# Patient Record
Sex: Female | Born: 1980 | Race: White | Hispanic: No | Marital: Married | Smoking: Never smoker
Health system: Southern US, Community
[De-identification: ages and names within clinical notes are randomized; demographics above are authoritative.]

## PROBLEM LIST (undated history)

## (undated) DIAGNOSIS — H9190 Unspecified hearing loss, unspecified ear: Secondary | ICD-10-CM

## (undated) DIAGNOSIS — T7840XA Allergy, unspecified, initial encounter: Secondary | ICD-10-CM

## (undated) DIAGNOSIS — N200 Calculus of kidney: Secondary | ICD-10-CM

## (undated) HISTORY — DX: Calculus of kidney: N20.0

## (undated) HISTORY — DX: Unspecified hearing loss, unspecified ear: H91.90

## (undated) HISTORY — DX: Allergy, unspecified, initial encounter: T78.40XA

## (undated) HISTORY — PX: INNER EAR SURGERY: SHX679

---

## 2010-01-14 HISTORY — PX: GASTRIC BYPASS: SHX52

## 2019-02-03 ENCOUNTER — Ambulatory Visit: Payer: Self-pay | Admitting: Internal Medicine

## 2019-03-02 ENCOUNTER — Encounter: Payer: Self-pay | Admitting: Internal Medicine

## 2019-03-02 ENCOUNTER — Other Ambulatory Visit: Payer: Self-pay

## 2019-03-02 ENCOUNTER — Ambulatory Visit (INDEPENDENT_AMBULATORY_CARE_PROVIDER_SITE_OTHER): Payer: Commercial Managed Care - PPO | Admitting: Internal Medicine

## 2019-03-02 VITALS — BP 124/82 | HR 90 | Temp 99.0°F | Ht 63.5 in | Wt 213.0 lb

## 2019-03-02 DIAGNOSIS — D1722 Benign lipomatous neoplasm of skin and subcutaneous tissue of left arm: Secondary | ICD-10-CM | POA: Diagnosis not present

## 2019-03-02 DIAGNOSIS — L729 Follicular cyst of the skin and subcutaneous tissue, unspecified: Secondary | ICD-10-CM | POA: Diagnosis not present

## 2019-03-02 DIAGNOSIS — Z9884 Bariatric surgery status: Secondary | ICD-10-CM | POA: Diagnosis not present

## 2019-03-02 NOTE — Progress Notes (Signed)
Date:  03/02/2019   Name:  Marcia Moore   DOB:  [birth year]   MRN:  [id number]  New patient to establish care. Chief Complaint: Establish Care, Back Pain (lower right side, present for 1 year, close to spine feels like a bump under skin ), and Mass (left arm, no pain, present for 1 year ) Last pap 2017 - normal Mammogram - none HPI   Review of Systems  Constitutional: Negative for fatigue and unexpected weight change.  Respiratory: Negative for cough, chest tightness and shortness of breath.   Cardiovascular: Negative for chest pain, palpitations and leg swelling.  Gastrointestinal: Negative for abdominal pain, constipation and diarrhea.  Genitourinary: Negative for dysuria and menstrual problem.  Musculoskeletal: Positive for arthralgias (left shoulder strain - much improved).  Skin:       Lumps on left arm and low back  Neurological: Negative for dizziness, light-headedness and headaches.  Psychiatric/Behavioral: Negative for dysphoric mood and sleep disturbance. The patient is not nervous/anxious.     Patient Active Problem List   Diagnosis Date Noted  . Status post gastric bypass for obesity 03/02/2019  . Lipoma of left upper extremity 03/02/2019    No Known Allergies  Past Surgical History:  Procedure Laterality Date  . CESAREAN SECTION    . GASTRIC BYPASS  2012  . INNER EAR SURGERY      Social History   Tobacco Use  . Smoking status: Never Smoker  . Smokeless tobacco: Never Used  Substance Use Topics  . Alcohol use: Never  . Drug use: Never     Medication list has been reviewed and updated.  Current Meds  Medication Sig  . cholecalciferol (VITAMIN D3) 25 MCG (1000 UNIT) tablet Take 1,000 Units by mouth daily.  . magnesium 30 MG tablet Take 30 mg by mouth 2 (two) times daily.    PHQ 2/9 Scores 03/02/2019  PHQ - 2 Score 0  PHQ- 9 Score 0    BP Readings from Last 3 Encounters:  03/02/19 124/82    Physical Exam Vitals and nursing note reviewed.    Constitutional:      General: She is not in acute distress.    Appearance: Normal appearance. She is well-developed.  HENT:     Head: Normocephalic and atraumatic.  Cardiovascular:     Rate and Rhythm: Normal rate and regular rhythm.     Pulses: Normal pulses.     Heart sounds: No murmur.  Pulmonary:     Effort: Pulmonary effort is normal. No respiratory distress.  Musculoskeletal:     Cervical back: Normal range of motion.     Right lower leg: No edema.     Left lower leg: No edema.  Lymphadenopathy:     Cervical: No cervical adenopathy.  Skin:    General: Skin is warm and dry.     Capillary Refill: Capillary refill takes less than 2 seconds.     Findings: No rash.       Neurological:     General: No focal deficit present.     Mental Status: She is alert and oriented to person, place, and time.  Psychiatric:        Attention and Perception: Attention normal.        Mood and Affect: Mood normal.        Speech: Speech normal.        Behavior: Behavior normal.        Thought Content: Thought content normal.  Cognition and Memory: Cognition normal.     Wt Readings from Last 3 Encounters:  03/02/19 213 lb (96.6 kg)    BP 124/82   Pulse 90   Temp 99 F (37.2 C) (Oral)   Ht 5' 3.5" (1.613 m)   Wt 213 lb (96.6 kg)   LMP 02/21/2018 (Exact Date)   SpO2 98%   BMI 37.14 kg/m   Assessment and Plan: 1. Lipoma of left upper extremity Pt is assured - benign findings If it enlarges or becomes symptomatic, it can be removed  2. Status post gastric bypass for obesity Doing well maintaining her weight loss  3. Cyst of skin Of the lower back - benign finding  Return for CPX and Pap in the near future. Partially dictated using Editor, commissioning. Any errors are unintentional.  Halina Maidens, MD [facility]  03/02/2019

## 2019-03-29 ENCOUNTER — Encounter: Payer: Self-pay | Admitting: Internal Medicine

## 2019-04-05 ENCOUNTER — Encounter: Payer: Self-pay | Admitting: Internal Medicine

## 2019-04-05 NOTE — Progress Notes (Deleted)
Date:  04/05/2019   Name:  Marcia Moore   DOB:  1980/02/19   MRN:  [id number]   Chief Complaint: No chief complaint on file. Marcia Moore is a 39 y.o. female who presents today for her Complete Annual Exam. She feels {DESC; WELL/FAIRLY WELL/POORLY:18703}. She reports exercising ***. She reports she is sleeping {DESC; WELL/FAIRLY WELL/POORLY:18703}.   Pap  There is no immunization history on file for this patient.  HPI    Review of Systems  Constitutional: Negative for chills, fatigue and fever.  HENT: Negative for congestion, hearing loss, tinnitus, trouble swallowing and voice change.   Eyes: Negative for visual disturbance.  Respiratory: Negative for cough, chest tightness, shortness of breath and wheezing.   Cardiovascular: Negative for chest pain, palpitations and leg swelling.  Gastrointestinal: Negative for abdominal pain, constipation, diarrhea and vomiting.  Endocrine: Negative for polydipsia and polyuria.  Genitourinary: Negative for dysuria, frequency, genital sores, vaginal bleeding and vaginal discharge.  Musculoskeletal: Negative for arthralgias, gait problem and joint swelling.  Skin: Negative for color change and rash.  Neurological: Negative for dizziness, tremors, light-headedness and headaches.  Hematological: Negative for adenopathy. Does not bruise/bleed easily.  Psychiatric/Behavioral: Negative for dysphoric mood and sleep disturbance. The patient is not nervous/anxious.     Patient Active Problem List   Diagnosis Date Noted  . Status post gastric bypass for obesity 03/02/2019  . Lipoma of left upper extremity 03/02/2019    No Known Allergies  Past Surgical History:  Procedure Laterality Date  . CESAREAN SECTION    . GASTRIC BYPASS  2012  . INNER EAR SURGERY      Social History   Tobacco Use  . Smoking status: Never Smoker  . Smokeless tobacco: Never Used  Substance Use Topics  . Alcohol use: Never  . Drug use: Never     Medication  list has been reviewed and updated.  No outpatient medications have been marked as taking for the 04/05/19 encounter (Appointment) with Glean Hess, MD.    [facility] 2/9 Scores 03/02/2019  PHQ - 2 Score 0  PHQ- 9 Score 0    BP Readings from Last 3 Encounters:  03/02/19 124/82    Physical Exam Vitals and nursing note reviewed.  Constitutional:      General: She is not in acute distress.    Appearance: She is well-developed.  HENT:     Head: Normocephalic and atraumatic.     Right Ear: Tympanic membrane and ear canal normal.     Left Ear: Tympanic membrane and ear canal normal.     Nose:     Right Sinus: No maxillary sinus tenderness.     Left Sinus: No maxillary sinus tenderness.  Eyes:     General: No scleral icterus.       Right eye: No discharge.        Left eye: No discharge.     Conjunctiva/sclera: Conjunctivae normal.  Neck:     Thyroid: No thyromegaly.     Vascular: No carotid bruit.  Cardiovascular:     Rate and Rhythm: Normal rate and regular rhythm.     Pulses: Normal pulses.     Heart sounds: Normal heart sounds.  Pulmonary:     Effort: Pulmonary effort is normal. No respiratory distress.     Breath sounds: No wheezing.  Chest:     Breasts:        Right: No mass, nipple discharge, skin change or tenderness.  Left: No mass, nipple discharge, skin change or tenderness.  Abdominal:     General: Bowel sounds are normal.     Palpations: Abdomen is soft.     Tenderness: There is no abdominal tenderness.  Musculoskeletal:        General: Normal range of motion.     Cervical back: Normal range of motion. No erythema.  Lymphadenopathy:     Cervical: No cervical adenopathy.  Skin:    General: Skin is warm and dry.     Findings: No rash.  Neurological:     Mental Status: She is alert and oriented to person, place, and time.     Cranial Nerves: No cranial nerve deficit.     Sensory: No sensory deficit.     Deep Tendon Reflexes: Reflexes are normal and  symmetric.  Psychiatric:        Speech: Speech normal.        Behavior: Behavior normal.        Thought Content: Thought content normal.     Wt Readings from Last 3 Encounters:  03/02/19 213 lb (96.6 kg)    There were no vitals taken for this visit.  Assessment and Plan:

## 2019-04-08 ENCOUNTER — Encounter: Payer: Self-pay | Admitting: Emergency Medicine

## 2019-04-08 ENCOUNTER — Ambulatory Visit
Admission: EM | Admit: 2019-04-08 | Discharge: 2019-04-08 | Disposition: A | Discharge status: Home / Self Care | Payer: Commercial Managed Care - PPO

## 2019-04-08 ENCOUNTER — Other Ambulatory Visit: Payer: Self-pay

## 2019-04-08 DIAGNOSIS — L0231 Cutaneous abscess of buttock: Secondary | ICD-10-CM | POA: Diagnosis not present

## 2019-04-08 NOTE — ED Triage Notes (Signed)
Pt c/o a "bump" between the anus and the vagina area. Started a couple weeks ago. She states that it is only painful when it is squeezed. No drainage.

## 2019-04-08 NOTE — Discharge Instructions (Signed)
Sit on a heating pad for 15 minutes 2-4 times a day to help the rest of the puss drain out. The packing may fall out in 12-24 hours or the next time you use the bathroom, that is OK, it is just to help the rest of the puss drain out and the opening is very small to keep the packing longer. Come back if this area closes and gets larger than when you came today.

## 2019-04-08 NOTE — ED Provider Notes (Signed)
[facility]    CSN: [id number] Arrival date & time: 04/08/19  [id number]      History   Chief Complaint Chief Complaint  Patient presents with  . Abscess    HPI Marcia Moore is a 39 y.o. female. who found a lump on the perineal area  2 weeks ago. Today is a little raw, but prior to this has not heard. Denies any new sexual encounters and her current partner does not have symptoms.  She has never had this before. She requests she is not placed on antibiotics and prefers to have it drained.     Past Medical History:  Diagnosis Date  . Allergy   . Hearing loss    left ear    Patient Active Problem List   Diagnosis Date Noted  . Status post gastric bypass for obesity 03/02/2019  . Lipoma of left upper extremity 03/02/2019    Past Surgical History:  Procedure Laterality Date  . CESAREAN SECTION    . GASTRIC BYPASS  2012  . INNER EAR SURGERY      OB History   No obstetric history on file.      Home Medications    Prior to Admission medications   Medication Sig Start Date End Date Taking? Authorizing Provider  cholecalciferol (VITAMIN D3) 25 MCG (1000 UNIT) tablet Take 1,000 Units by mouth daily.   Yes [provider]  COLLAGEN PO Take by mouth.   Yes [provider]  magnesium 30 MG tablet Take 30 mg by mouth 2 (two) times daily.   Yes [provider]    Family History Family History  Problem Relation Age of Onset  . Diabetes Father   . Hypertension Father   . Bone cancer Paternal Grandfather     Social History Social History   Tobacco Use  . Smoking status: Never Smoker  . Smokeless tobacco: Never Used  Substance Use Topics  . Alcohol use: Never  . Drug use: Never     Allergies   Patient has no known allergies.   Review of Systems Review of Systems  Constitutional: Negative for chills and fever.  Musculoskeletal: Negative for myalgias.  Skin:       Lump on R inner buttocks  Hematological: Negative for  adenopathy.     Physical Exam Triage Vital Signs ED Triage Vitals  Enc Vitals Group     BP 04/08/19 0921 (!) 136/92     Pulse Rate 04/08/19 0921 (!) 108     Resp 04/08/19 0921 18     Temp 04/08/19 0921 98.2 F (36.8 C)     Temp Source 04/08/19 0921 Oral     SpO2 04/08/19 0921 100 %     Weight 04/08/19 0918 207 lb (93.9 kg)     Height 04/08/19 0918 5\' 3"  (1.6 m)     Head Circumference --      Peak Flow --      Pain Score 04/08/19 0918 0     Pain Loc --      Pain Edu? --      Excl. in [place]? --    No data found.  Updated Vital Signs BP (!) 136/92 (BP Location: Left Arm)   Pulse (!) 108   Temp 98.2 F (36.8 C) (Oral)   Resp 18   Ht 5\' 3"  (1.6 m)   Wt 207 lb (93.9 kg)   LMP 04/02/2019 (Approximate)   SpO2 100%   BMI 36.67 kg/m   Visual  Acuity Right Eye Distance:   Left Eye Distance:   Bilateral Distance:    Right Eye Near:   Left Eye Near:    Bilateral Near:     Physical Exam Vitals and nursing note reviewed.  Constitutional:      Appearance: She is obese.  HENT:     Right Ear: External ear normal.     Left Ear: External ear normal.  Eyes:     General: No scleral icterus.    Conjunctiva/sclera: Conjunctivae normal.  Pulmonary:     Effort: Pulmonary effort is normal.  Genitourinary:    General: Normal vulva.     Rectum: Normal.  Musculoskeletal:        General: Normal range of motion.     Cervical back: Neck supple.  Skin:    General: Skin is warm and dry.     Comments: 1.3 x 1.3 cm fluctuant mass present on R inner buttocks about 2 cm from anus. Has mild tenderness and erythema.   Neurological:     Mental Status: She is alert and oriented to person, place, and time.     Gait: Gait normal.  Psychiatric:        Mood and Affect: Mood normal.        Behavior: Behavior normal.        Thought Content: Thought content normal.        Judgment: Judgment normal.      UC Treatments / Results  Labs (all labs ordered are listed, but only abnormal results  are displayed) Labs Reviewed - No data to display  EKG   Radiology No results found.  Procedures Incision and Drainage  Date/Time: 04/08/2019 10:17 AM Performed by: Shelby Mattocks, PA-C Authorized by: Shelby Mattocks, PA-C   Consent:    Consent obtained:  Verbal   Consent given by:  Patient   Risks discussed:  Pain Location:    Type:  Abscess   Location: R inner buttocks. Pre-procedure details:    Skin preparation:  Betadine Anesthesia (see MAR for exact dosages):    Anesthesia method:  Local infiltration   Local anesthetic:  Lidocaine 1% WITH epi Procedure type:    Complexity:  Simple Procedure details:    Needle aspiration: no     Incision types:  Stab incision   Incision depth:  Dermal   Scalpel blade:  11   Wound management:  Irrigated with saline   Drainage:  Purulent   Drainage amount:  Scant   Wound treatment:  Wound left open   Packing materials:  1/2 in gauze   Amount 1/2":  1.2 cm Post-procedure details:    Patient tolerance of procedure:  Tolerated well, no immediate complications Comments:     4 folded 4x4's applied on area between but cheeks, but no tape was applied.    (including critical care time)  Medications Ordered in UC Medications - No data to display  Initial Impression / Assessment and Plan / UC Course  I have reviewed the triage vital signs and the nursing notes. Wound care instructed. Per her request I did not place her on antibiotics. FU if she gets worse.   Final Clinical Impressions(s) / UC Diagnoses   Final diagnoses:  None   Discharge Instructions   None    ED Prescriptions    None     PDMP not reviewed this encounter.   Shelby Mattocks, PA-C 04/08/19 1149

## 2019-04-12 ENCOUNTER — Encounter: Payer: Self-pay | Admitting: Internal Medicine

## 2019-04-14 ENCOUNTER — Other Ambulatory Visit (HOSPITAL_COMMUNITY)
Admission: RE | Admit: 2019-04-14 | Discharge: 2019-04-14 | Disposition: A | Discharge status: Home / Self Care | Payer: Commercial Managed Care - PPO | Source: Ambulatory Visit | Attending: Internal Medicine | Admitting: Internal Medicine

## 2019-04-14 ENCOUNTER — Encounter: Payer: Self-pay | Admitting: Internal Medicine

## 2019-04-14 ENCOUNTER — Other Ambulatory Visit: Payer: Self-pay

## 2019-04-14 ENCOUNTER — Telehealth: Payer: Self-pay

## 2019-04-14 ENCOUNTER — Ambulatory Visit (INDEPENDENT_AMBULATORY_CARE_PROVIDER_SITE_OTHER): Payer: Commercial Managed Care - PPO | Admitting: Internal Medicine

## 2019-04-14 VITALS — BP 108/68 | HR 96 | Temp 97.1°F | Ht 63.5 in | Wt 212.0 lb

## 2019-04-14 DIAGNOSIS — N6012 Diffuse cystic mastopathy of left breast: Secondary | ICD-10-CM

## 2019-04-14 DIAGNOSIS — N6011 Diffuse cystic mastopathy of right breast: Secondary | ICD-10-CM | POA: Diagnosis not present

## 2019-04-14 DIAGNOSIS — Z124 Encounter for screening for malignant neoplasm of cervix: Secondary | ICD-10-CM | POA: Insufficient documentation

## 2019-04-14 DIAGNOSIS — R3121 Asymptomatic microscopic hematuria: Secondary | ICD-10-CM

## 2019-04-14 DIAGNOSIS — Z9884 Bariatric surgery status: Secondary | ICD-10-CM

## 2019-04-14 DIAGNOSIS — Z0000 Encounter for general adult medical examination without abnormal findings: Secondary | ICD-10-CM

## 2019-04-14 LAB — POCT URINALYSIS DIPSTICK
Bilirubin, UA: NEGATIVE
Glucose, UA: NEGATIVE
Ketones, UA: NEGATIVE
Leukocytes, UA: NEGATIVE
Nitrite, UA: NEGATIVE
Protein, UA: POSITIVE — AB
Spec Grav, UA: 1.015 (ref 1.010–1.025)
Urobilinogen, UA: 0.2 E.U./dL
pH, UA: 7 (ref 5.0–8.0)

## 2019-04-14 MED ORDER — CIPROFLOXACIN HCL 250 MG PO TABS: 250.0000 mg | ORAL_TABLET | Freq: Two times a day (BID) | ORAL | 0 refills | Status: AC

## 2019-04-14 NOTE — Progress Notes (Signed)
Date:  04/14/2019   Name:  Marcia Moore   DOB:  Jul 15, 1980   MRN:  [id number]   Chief Complaint: Annual Exam (Breast exam and papsmear.) Marcia Moore is a 39 y.o. female who presents today for her Complete Annual Exam. She feels well. She reports exercising intermittently. She reports she is sleeping fairly well. She denies breast issues.  Menses are regular.  She is following a keto diet.  She is concerned about her cholesterol and ask about a lipo-protein panel.  Pap - unknown  There is no immunization history on file for this patient.  HPI  Review of Systems  Constitutional: Negative for fatigue and unexpected weight change.  HENT: Negative for hearing loss and trouble swallowing.   Eyes: Negative for visual disturbance.  Respiratory: Negative for cough, chest tightness and shortness of breath.   Cardiovascular: Negative for chest pain, palpitations and leg swelling.  Gastrointestinal: Negative for abdominal pain, constipation and diarrhea.  Genitourinary: Negative for dysuria, frequency, hematuria and menstrual problem.  Musculoskeletal: Positive for arthralgias (left shoulder strain - much improved).  Skin: Positive for wound (abscess near buttock - drained last week).       Lipoma on arm and cyst on back - unchanged  Neurological: Negative for dizziness, light-headedness and headaches.  Hematological: Negative for adenopathy.  Psychiatric/Behavioral: Negative for dysphoric mood and sleep disturbance. The patient is not nervous/anxious.     Patient Active Problem List   Diagnosis Date Noted  . Status post gastric bypass for obesity 03/02/2019  . Lipoma of left upper extremity 03/02/2019    No Known Allergies  Past Surgical History:  Procedure Laterality Date  . CESAREAN SECTION    . GASTRIC BYPASS  2012  . INNER EAR SURGERY      Social History   Tobacco Use  . Smoking status: Never Smoker  . Smokeless tobacco: Never Used  Substance Use Topics  . Alcohol use:  Never  . Drug use: Never     Medication list has been reviewed and updated.  Current Meds  Medication Sig  . cholecalciferol (VITAMIN D3) 25 MCG (1000 UNIT) tablet Take 1,000 Units by mouth daily.  . COLLAGEN PO Take by mouth.  . magnesium 30 MG tablet Take 30 mg by mouth 2 (two) times daily.    PHQ 2/9 Scores 04/14/2019 03/02/2019  PHQ - 2 Score 0 0  PHQ- 9 Score - 0    BP Readings from Last 3 Encounters:  04/14/19 108/68  04/08/19 (!) 136/92  03/02/19 124/82    Physical Exam Vitals and nursing note reviewed.  Constitutional:      General: She is not in acute distress.    Appearance: She is well-developed.  HENT:     Head: Normocephalic and atraumatic.     Right Ear: Tympanic membrane and ear canal normal.     Left Ear: Tympanic membrane and ear canal normal.  Eyes:     General: No scleral icterus.       Right eye: No discharge.        Left eye: No discharge.     Conjunctiva/sclera: Conjunctivae normal.  Neck:     Thyroid: No thyromegaly.     Vascular: No carotid bruit.  Cardiovascular:     Rate and Rhythm: Normal rate and regular rhythm.     Pulses: Normal pulses.     Heart sounds: Normal heart sounds.  Pulmonary:     Effort: Pulmonary effort is normal. No respiratory distress.  Breath sounds: No wheezing.  Chest:     Breasts:        Right: No mass, nipple discharge, skin change or tenderness.        Left: No mass, nipple discharge, skin change or tenderness.     Comments: Fibrocystic changes in both breasts Abdominal:     General: Bowel sounds are normal.     Palpations: Abdomen is soft.     Tenderness: There is no abdominal tenderness.  Genitourinary:    Labia:        Right: No tenderness, lesion or injury.        Left: No tenderness, lesion or injury.      Vagina: Normal.     Cervix: Normal.     Uterus: Normal.      Adnexa: Right adnexa normal and left adnexa normal.    Musculoskeletal:        General: Normal range of motion.     Cervical  back: Normal range of motion. No erythema.     Right lower leg: No edema.     Left lower leg: No edema.  Lymphadenopathy:     Cervical: No cervical adenopathy.  Skin:    General: Skin is warm and dry.     Capillary Refill: Capillary refill takes less than 2 seconds.     Findings: No rash.  Neurological:     General: No focal deficit present.     Mental Status: She is alert and oriented to person, place, and time.     Cranial Nerves: No cranial nerve deficit.     Sensory: No sensory deficit.     Deep Tendon Reflexes: Reflexes are normal and symmetric.  Psychiatric:        Speech: Speech normal.        Behavior: Behavior normal.        Thought Content: Thought content normal.     Wt Readings from Last 3 Encounters:  04/14/19 212 lb (96.2 kg)  04/08/19 207 lb (93.9 kg)  03/02/19 213 lb (96.6 kg)    BP 108/68   Pulse 96   Temp (!) 97.1 F (36.2 C) (Temporal)   Ht 5' 3.5" (1.613 m)   Wt 212 lb (96.2 kg)   LMP 04/02/2019 (Approximate)   SpO2 98%   BMI 36.97 kg/m   Assessment and Plan: 1. Annual physical exam Normal exam except for weight Work on diet and exercise Abscess site healed - no further intervention needed - CBC with Differential/Platelet - Comprehensive metabolic panel - Lipid panel - TSH - POCT urinalysis dipstick  2. Encounter for screening for cervical cancer  obtained - Cytology - PAP  3. Status post gastric bypass for obesity Continue ketogenic diet; more regular exercise  4. Fibrocystic breast changes of both breasts No worrisome findings Pt to continue breast self awareness Routine mammograms age 32  5. Asymptomatic microscopic hematuria Will treat for cystitis then recheck urine in one month or mid cycle Refer if persistent. - ciprofloxacin (CIPRO) 250 MG tablet; Take 1 tablet (250 mg total) by mouth 2 (two) times daily for 5 days.  Dispense: 10 tablet; Refill: 0   Partially dictated using Editor, commissioning. Any errors are  unintentional.  Marcia Maidens, MD [facility]  04/14/2019

## 2019-04-14 NOTE — Telephone Encounter (Signed)
Called pt per Dr. Army Melia for urine today. Pt informed that she has large RBC and abx were sent in for her take.   Put her on the schedule for nurse visit in a month to recheck her urine ONLY.  CM

## 2019-04-15 ENCOUNTER — Encounter: Payer: Self-pay | Admitting: Internal Medicine

## 2019-04-15 DIAGNOSIS — E785 Hyperlipidemia, unspecified: Secondary | ICD-10-CM | POA: Insufficient documentation

## 2019-04-15 LAB — LIPID PANEL
Chol/HDL Ratio: 3.4 ratio (ref 0.0–4.4)
Cholesterol, Total: 253 mg/dL — ABNORMAL HIGH (ref 100–199)
HDL: 74 mg/dL (ref >39)
LDL Chol Calc (NIH): 159 mg/dL — ABNORMAL HIGH (ref 0–99)
Triglycerides: 115 mg/dL (ref 0–149)
VLDL Cholesterol Cal: 20 mg/dL (ref 5–40)

## 2019-04-15 LAB — CBC WITH DIFFERENTIAL/PLATELET
Basophils Absolute: 0.1 10*3/uL (ref 0.0–0.2)
Basos: 1 %
EOS (ABSOLUTE): 0.2 10*3/uL (ref 0.0–0.4)
Eos: 3 %
Hematocrit: 42.3 % (ref 34.0–46.6)
Hemoglobin: 14 g/dL (ref 11.1–15.9)
Immature Grans (Abs): 0 10*3/uL (ref 0.0–0.1)
Immature Granulocytes: 0 %
Lymphocytes Absolute: 2.1 10*3/uL (ref 0.7–3.1)
Lymphs: 32 %
MCH: 28.9 pg (ref 26.6–33.0)
MCHC: 33.1 g/dL (ref 31.5–35.7)
MCV: 87 fL (ref 79–97)
Monocytes Absolute: 0.5 10*3/uL (ref 0.1–0.9)
Monocytes: 8 %
Neutrophils Absolute: 3.7 10*3/uL (ref 1.4–7.0)
Neutrophils: 56 %
Platelets: 285 10*3/uL (ref 150–450)
RBC: 4.85 x10E6/uL (ref 3.77–5.28)
RDW: 13 % (ref 11.7–15.4)
WBC: 6.6 10*3/uL (ref 3.4–10.8)

## 2019-04-15 LAB — COMPREHENSIVE METABOLIC PANEL
ALT: 25 IU/L (ref 0–32)
AST: 21 IU/L (ref 0–40)
Albumin/Globulin Ratio: 1.6 (ref 1.2–2.2)
Albumin: 3.9 g/dL (ref 3.8–4.8)
Alkaline Phosphatase: 57 IU/L (ref 39–117)
BUN/Creatinine Ratio: 14 (ref 9–23)
BUN: 13 mg/dL (ref 6–20)
Bilirubin Total: <0.2 mg/dL (ref 0.0–1.2)
CO2: 24 mmol/L (ref 20–29)
Calcium: 9.9 mg/dL (ref 8.7–10.2)
Chloride: 103 mmol/L (ref 96–106)
Creatinine, Ser: 0.91 mg/dL (ref 0.57–1.00)
GFR calc Af Amer: 93 mL/min/{1.73_m2} (ref >59)
GFR calc non Af Amer: 80 mL/min/{1.73_m2} (ref >59)
Globulin, Total: 2.5 g/dL (ref 1.5–4.5)
Glucose: 91 mg/dL (ref 65–99)
Potassium: 4.6 mmol/L (ref 3.5–5.2)
Sodium: 140 mmol/L (ref 134–144)
Total Protein: 6.4 g/dL (ref 6.0–8.5)

## 2019-04-15 LAB — CYTOLOGY - PAP
Comment: NEGATIVE
Diagnosis: NEGATIVE
High risk HPV: NEGATIVE

## 2019-04-15 LAB — TSH: TSH: 2.08 u[IU]/mL (ref 0.450–4.500)

## 2019-04-21 ENCOUNTER — Encounter: Payer: Self-pay | Admitting: Emergency Medicine

## 2019-04-21 ENCOUNTER — Ambulatory Visit
Admission: EM | Admit: 2019-04-21 | Discharge: 2019-04-21 | Disposition: A | Discharge status: Home / Self Care | Payer: Commercial Managed Care - PPO | Attending: Family Medicine | Admitting: Family Medicine

## 2019-04-21 ENCOUNTER — Ambulatory Visit (INDEPENDENT_AMBULATORY_CARE_PROVIDER_SITE_OTHER): Payer: Commercial Managed Care - PPO

## 2019-04-21 ENCOUNTER — Other Ambulatory Visit: Payer: Self-pay

## 2019-04-21 DIAGNOSIS — M25562 Pain in left knee: Secondary | ICD-10-CM

## 2019-04-21 DIAGNOSIS — M1712 Unilateral primary osteoarthritis, left knee: Secondary | ICD-10-CM | POA: Diagnosis not present

## 2019-04-21 MED ORDER — MELOXICAM 15 MG PO TABS: 15.0000 mg | ORAL_TABLET | Freq: Every day | ORAL | 0 refills | Status: AC

## 2019-04-21 NOTE — Discharge Instructions (Signed)
Rest, ice and elevation.  Medication as directed.  If persists, see Ortho.  [facility] [phone]) OR [facility] [phone])  Take care  Dr. Lacinda Axon

## 2019-04-21 NOTE — ED Triage Notes (Signed)
Pt c/o left knee pain. Started about 2 days ago. No known injury. She states there is some swelling and pain is worse when fully extending the knee.

## 2019-04-21 NOTE — ED Provider Notes (Signed)
[facility]    CSN: [id number] Arrival date & time: 04/21/19  1800      History   Chief Complaint Chief Complaint  Patient presents with  . Knee Pain    left   HPI  39 year old female presents with the above complaint.  Patient reports left knee pain which started on Monday.  No reported fall, trauma, injury.  No real changes in activity.  Patient does report that she is Muslim and pain is frequently on her hands and knees.  She states that she did so in a parking lot on asphalt as there was no private place to pray.  She is unsure if this is contributing.  She reports left knee pain, swelling and mild warmth.  Pain 8/10 in severity.  Worse with extension of the knee.  No relieving factors.  Patient is concerned that she may have a blood clot.  No known risk factors.  No other associated symptoms.  No other complaints.  Patient Active Problem List   Diagnosis Date Noted  . Mild hyperlipidemia 04/15/2019  . Asymptomatic microscopic hematuria 04/14/2019  . Fibrocystic breast changes of both breasts 04/14/2019  . Status post gastric bypass for obesity 03/02/2019  . Lipoma of left upper extremity 03/02/2019    Past Surgical History:  Procedure Laterality Date  . CESAREAN SECTION    . GASTRIC BYPASS  2012  . INNER EAR SURGERY      OB History   No obstetric history on file.      Home Medications    Prior to Admission medications   Medication Sig Start Date End Date Taking? Authorizing Provider  cholecalciferol (VITAMIN D3) 25 MCG (1000 UNIT) tablet Take 1,000 Units by mouth daily.   Yes [provider]  COLLAGEN PO Take by mouth.   Yes [provider]  magnesium 30 MG tablet Take 30 mg by mouth 2 (two) times daily.   Yes [provider]  meloxicam (MOBIC) 15 MG tablet Take 1 tablet (15 mg total) by mouth daily for 14 days. 04/21/19 05/05/19  Coral Spikes, DO    Family History Family History  Problem Relation Age of Onset  .  Diabetes Father   . Hypertension Father   . Bone cancer Paternal Grandfather     Social History Social History   Tobacco Use  . Smoking status: Never Smoker  . Smokeless tobacco: Never Used  Substance Use Topics  . Alcohol use: Never  . Drug use: Never     Allergies   Patient has no known allergies.   Review of Systems Review of Systems  Constitutional: Negative.   Musculoskeletal:       Left knee pain, swelling.   Physical Exam Triage Vital Signs ED Triage Vitals  Enc Vitals Group     BP 04/21/19 1815 (!) 136/95     Pulse Rate 04/21/19 1815 100     Resp 04/21/19 1815 18     Temp 04/21/19 1815 98.3 F (36.8 C)     Temp Source 04/21/19 1815 Oral     SpO2 04/21/19 1815 100 %     Weight 04/21/19 1812 208 lb (94.3 kg)     Height 04/21/19 1812 5\' 3"  (1.6 m)     Head Circumference --      Peak Flow --      Pain Score 04/21/19 1812 8     Pain Loc --      Pain Edu? --  Excl. in [place]? --    Updated Vital Signs BP (!) 136/95 (BP Location: Left Arm)   Pulse 100   Temp 98.3 F (36.8 C) (Oral)   Resp 18   Ht 5\' 3"  (1.6 m)   Wt 94.3 kg   LMP 04/02/2019 (Approximate)   SpO2 100%   BMI 36.85 kg/m   Visual Acuity Right Eye Distance:   Left Eye Distance:   Bilateral Distance:    Right Eye Near:   Left Eye Near:    Bilateral Near:     Physical Exam Vitals and nursing note reviewed.  Constitutional:      General: She is not in acute distress.    Appearance: Normal appearance. She is obese. She is not ill-appearing.  Eyes:     General:        Right eye: No discharge.        Left eye: No discharge.     Conjunctiva/sclera: Conjunctivae normal.  Cardiovascular:     Rate and Rhythm: Normal rate and regular rhythm.     Heart sounds: No murmur.  Pulmonary:     Effort: Pulmonary effort is normal.     Breath sounds: Normal breath sounds.  Musculoskeletal:     Comments: Left knee -effusion noted.  No discrete tenderness to palpation.  Ligaments intact.    Neurological:     Mental Status: She is alert.  Psychiatric:        Mood and Affect: Mood normal.        Behavior: Behavior normal.    UC Treatments / Results  Labs (all labs ordered are listed, but only abnormal results are displayed) Labs Reviewed - No data to display  EKG   Radiology DG Knee Complete 4 Views Left  Result Date: 04/21/2019 CLINICAL DATA:  Left knee pain for 2 days with swelling. No reported injury. EXAM: LEFT KNEE - COMPLETE 4+ VIEW COMPARISON:  None. FINDINGS: No fracture or dislocation. Small suprapatellar left knee joint effusion. Mild tricompartmental left knee osteoarthritis. Small superolateral left patellar enthesophytes. No suspicious focal osseous lesions. No radiopaque foreign bodies. IMPRESSION: Small suprapatellar left knee joint effusion. Mild tricompartmental left knee osteoarthritis. Small superolateral left patellar enthesophytes. No fracture or dislocation. Electronically Signed   By: Ilona Sorrel M.D.   On: 04/21/2019 18:51    Procedures Procedures (including critical care time)  Medications Ordered in UC Medications - No data to display  Initial Impression / Assessment and Plan / UC Course  I have reviewed the triage vital signs and the nursing notes.  Pertinent labs & imaging results that were available during my care of the patient were reviewed by me and considered in my medical decision making (see chart for details).    39 year old female presents with acute knee pain.  X-ray revealed mild tricompartmental arthritis.  Mild effusion.  Advised rest, ice, elevation.  I initially prescribed meloxicam but patient cannot take NSAIDs due to prior gastric surgery.  Advised Tylenol as needed.  If persists will see orthopedics.    Final Clinical Impressions(s) / UC Diagnoses   Final diagnoses:  Acute pain of left knee  Primary osteoarthritis of left knee     Discharge Instructions     Rest, ice and elevation.  Medication as  directed.  If persists, see Ortho.  [place] [phone]) OR [facility] [phone])  Take care  Dr. Lacinda Axon    ED Prescriptions    Medication Sig Dispense Auth. Provider   meloxicam (MOBIC) 15 MG tablet  Take 1 tablet (15 mg total) by mouth daily for 14 days. 14 tablet Coral Spikes, DO     PDMP not reviewed this encounter.   Coral Spikes, [place] 04/21/19 2026

## 2019-05-12 ENCOUNTER — Telehealth: Payer: Self-pay | Admitting: Internal Medicine

## 2019-05-12 ENCOUNTER — Other Ambulatory Visit: Payer: Self-pay

## 2019-05-12 ENCOUNTER — Other Ambulatory Visit (INDEPENDENT_AMBULATORY_CARE_PROVIDER_SITE_OTHER): Payer: Commercial Managed Care - PPO | Admitting: Internal Medicine

## 2019-05-12 DIAGNOSIS — R3129 Other microscopic hematuria: Secondary | ICD-10-CM

## 2019-05-12 DIAGNOSIS — R3121 Asymptomatic microscopic hematuria: Secondary | ICD-10-CM

## 2019-05-12 LAB — POC URINALYSIS WITH MICROSCOPIC (NON AUTO)MANUAL RESULT
Bacteria, UA: 0
Bilirubin, UA: NEGATIVE
Crystals: 0
Glucose, UA: NEGATIVE
Ketones, UA: NEGATIVE
Leukocytes, UA: NEGATIVE
Mucus, UA: 0
Nitrite, UA: NEGATIVE
Protein, UA: POSITIVE — AB
RBC: 10 M/uL — AB (ref 4.04–5.48)
Spec Grav, UA: 1.02 (ref 1.010–1.025)
Urobilinogen, UA: 0.2 E.U./dL
WBC Casts, UA: 0
pH, UA: 5 (ref 5.0–8.0)

## 2019-05-12 NOTE — Telephone Encounter (Signed)
Urinalysis shows persistent microscopic red blood cells.  Will refer to Urology for further evaluation.

## 2019-05-12 NOTE — Telephone Encounter (Signed)
Called and left pt a detailed msg letting her know that her urine shows persistent microscopic blood. Told her Dr Army Melia referred her to urology and someone should be calling her in the next week to schedule an appt with Urology.   Told her to call back if she does not hear anything about the referral.  CM

## 2019-05-13 NOTE — Progress Notes (Signed)
05/14/19 8:54 AM   Marcia Moore January 06, 1981 [id number]  Referring provider: Glean Hess, MD [address] [place],  [place] [address] Chief Complaint  Patient presents with  . Hematuria    New Patient    HPI: Marcia Moore is a 39 y.o. F who presents today for the evaluation and management of asymptomatic microscopic hematuria.   Her UA from 05/12/19 revealed 10 RBC.   She reports of seeing dark urine occasionally. She was not on her period when she gave her urine sample 2 days ago. She denies flank pain however reports of pain when she is menstruating. She denies incontinence and burning w/ urination. She has had a couple UTIs in the past w/o symptoms.   She reports of taking Vit D.   No prior hx of kidney stones. FHx of kidney stones.   Never smoker.    PMH: Past Medical History:  Diagnosis Date  . Allergy   . Hearing loss    left ear    Surgical History: Past Surgical History:  Procedure Laterality Date  . CESAREAN SECTION    . GASTRIC BYPASS  2012  . INNER EAR SURGERY      Home Medications:  Allergies as of 05/14/2019   No Known Allergies     Medication List       Accurate as of May 14, 2019  8:54 AM. If you have any questions, ask your nurse or doctor.        cholecalciferol 25 MCG (1000 UNIT) tablet Commonly known as: VITAMIN D3 Take 1,000 Units by mouth daily.   COLLAGEN PO Take by mouth.   magnesium 30 MG tablet Take 30 mg by mouth 2 (two) times daily.       Allergies: No Known Allergies  Family History: Family History  Problem Relation Age of Onset  . Diabetes Father   . Hypertension Father   . Bone cancer Paternal Grandfather   . Bladder Cancer Neg Hx   . Kidney cancer Neg Hx   . Prostate cancer Neg Hx     Social History:  reports that she has never smoked. She has never used smokeless tobacco. She reports that she does not drink alcohol or use drugs.   Physical Exam: BP 136/90   Pulse 98   Ht 5\' 3"  (1.6  m)   Wt 211 lb (95.7 kg)   BMI 37.38 kg/m   Constitutional:  Alert and oriented, No acute distress. HEENT: [place] AT, moist mucus membranes.  Trachea midline, no masses. Cardiovascular: No clubbing, cyanosis, or edema. Respiratory: Normal respiratory effort, no increased work of breathing. Skin: No rashes, bruises or suspicious lesions. Neurologic: Grossly intact, no focal deficits, moving all 4 extremities. Psychiatric: Normal mood and affect.  Laboratory Data:  Lab Results  Component Value Date   CREATININE 0.91 04/14/2019   Assessment & Plan:    1. Microscopic hematuria  Low risk given she's young and healthy w/ no smoking hx   We discussed the differential diagnosis for microscopic hematuria including nephrolithiasis, renal or upper tract tumors, bladder stones, UTIs, or bladder tumors as well as undetermined etiologies.  Per AUA guidelines, I did recommend complete microscopic hematuria evaluation including RUS, possible urine cytology, and office cystoscopy.  Return in about 2 weeks (around 05/28/2019) for cysto/ renal ultrasound.  [facility] [address], [place], [place] [address] [phone]  I, Lucas Mallow, am acting as a scribe for Dr. Hollice Espy,  I have  reviewed the above documentation for accuracy and completeness, and I agree with the above.   Hollice Espy, MD

## 2019-05-14 ENCOUNTER — Ambulatory Visit (INDEPENDENT_AMBULATORY_CARE_PROVIDER_SITE_OTHER): Payer: Commercial Managed Care - PPO | Admitting: Urology

## 2019-05-14 ENCOUNTER — Encounter: Payer: Self-pay | Admitting: Urology

## 2019-05-14 ENCOUNTER — Other Ambulatory Visit: Payer: Self-pay

## 2019-05-14 VITALS — BP 136/90 | HR 98 | Ht 63.0 in | Wt 211.0 lb

## 2019-05-14 DIAGNOSIS — R3121 Asymptomatic microscopic hematuria: Secondary | ICD-10-CM | POA: Diagnosis not present

## 2019-05-14 NOTE — Patient Instructions (Signed)

## 2019-05-24 ENCOUNTER — Ambulatory Visit: Payer: Commercial Managed Care - PPO

## 2019-05-24 ENCOUNTER — Ambulatory Visit
Admission: RE | Admit: 2019-05-24 | Discharge: 2019-05-24 | Disposition: A | Discharge status: Home / Self Care | Payer: Commercial Managed Care - PPO | Source: Ambulatory Visit | Attending: Urology | Admitting: Urology

## 2019-05-24 ENCOUNTER — Other Ambulatory Visit: Payer: Self-pay

## 2019-05-24 DIAGNOSIS — R3121 Asymptomatic microscopic hematuria: Secondary | ICD-10-CM | POA: Insufficient documentation

## 2019-05-28 ENCOUNTER — Telehealth: Payer: Self-pay | Admitting: Urology

## 2019-05-28 ENCOUNTER — Ambulatory Visit: Payer: Commercial Managed Care - PPO | Admitting: Urology

## 2019-05-28 NOTE — Telephone Encounter (Signed)
She has a few small kidney stones otherwise her kidneys look healthy which we were going to discuss at her follow up.    She really needs to follow-up with cystoscopy as discussed.  Please encourage her to reschedule this appointment.  Hollice Espy, MD

## 2019-05-28 NOTE — Telephone Encounter (Signed)
Pt called office asking about her RUS results that she had on 5/10.

## 2019-05-28 NOTE — Telephone Encounter (Signed)
Patient notified, scheduled cysto. Cancelled prior cysto due to her period.

## 2019-06-14 NOTE — Progress Notes (Signed)
   06/15/19   CC:  Chief Complaint  Patient presents with  . Cysto    HPI: Marcia Moore is a 39 y.o. F w/ hx of microscopic hematuria returns today for a cystoscopy.   Her UA from 05/12/19 revealed 10 RBC.   RUS from 05/24/19 revealed small bilateral nonobstructing renal stones w/ no hydronephrosis.   She follows a ketogenic diet.   No prior hx of kidney stones. FHx of kidney stones.   Never smoker.   Today's Vitals   06/15/19 1616  BP: (!) 153/100  Pulse: (!) 108   There is no height or weight on file to calculate BMI. NED. A&Ox3.   No respiratory distress   Abd soft, NT, ND Normal external genitalia with patent urethral meatus  Cystoscopy Procedure Note  Patient identification was confirmed, informed consent was obtained, and patient was prepped using Betadine solution.  Lidocaine jelly was administered per urethral meatus.    Procedure: - Flexible cystoscope introduced, without any difficulty.   - Thorough search of the bladder revealed:    normal urethral meatus    normal urothelium    no stones    no ulcers     no tumors    no urethral polyps    no trabeculation    Mild trigonitis   - Ureteral orifices were normal in position and appearance.  Post-Procedure: - Patient tolerated the procedure well  Pertinent Imagings:  CLINICAL DATA:  Asymptomatic microscopic hematuria  EXAM: RENAL / URINARY TRACT ULTRASOUND COMPLETE  COMPARISON:  None.  FINDINGS: Right Kidney:  Renal measurements: 11.2 x 4.2 x 4.7 cm = volume: 114 mL. 6 mm echogenic focus in the upper pole, likely small nonobstructing stone. No mass or hydronephrosis.  Left Kidney:  Renal measurements: 10.6 x 4.2 x 5.3 cm = volume: 123 mL. 6 mm echogenic focus in the lower pole, likely nonobstructing stone. No mass or hydronephrosis.  Bladder:  Appears normal for degree of bladder distention.  Other:  None.  IMPRESSION: Suspect small bilateral nonobstructing renal  stones.  No hydronephrosis.   Electronically Signed   By: Rolm Baptise M.D.   On: 05/24/2019 08:49  I have personally reviewed the images and agree with radiologist interpretation.   Assessment/ Plan:  1. Microscopic hematuria Mild trigonitis but not pathologic or worrisome Will follow clinically   2. Nephrolithiasis  RUS from 05/24/19 revealed small bilateral nonobstructing renal stones w/ no hydronephrosis.  We discussed general stone prevention techniques including drinking plenty water with goal of producing 2.5 L urine daily, increased citric acid intake, avoidance of high oxalate containing foods, and decreased salt intake.  Information about dietary recommendations given today.  Literature given on stone diet Recommended seeing a nutritionist given her dietary restraint and ketogenic diet Return in 3 months w/ KUB   I, Nethusan Sivanesan, am acting as a scribe for Dr. Hollice Espy,  I have reviewed the above documentation for accuracy and completeness, and I agree with the above.   Hollice Espy, MD

## 2019-06-15 ENCOUNTER — Encounter: Payer: Self-pay | Admitting: Urology

## 2019-06-15 ENCOUNTER — Ambulatory Visit (INDEPENDENT_AMBULATORY_CARE_PROVIDER_SITE_OTHER): Payer: Commercial Managed Care - PPO | Admitting: Urology

## 2019-06-15 ENCOUNTER — Other Ambulatory Visit: Payer: Self-pay

## 2019-06-15 VITALS — BP 153/100 | HR 108

## 2019-06-15 DIAGNOSIS — R3121 Asymptomatic microscopic hematuria: Secondary | ICD-10-CM | POA: Diagnosis not present

## 2019-06-16 LAB — MICROSCOPIC EXAMINATION
Bacteria, UA: NONE SEEN
RBC, Urine: >30 /hpf — AB (ref 0–2)

## 2019-06-16 LAB — URINALYSIS, COMPLETE
Bilirubin, UA: NEGATIVE
Glucose, UA: NEGATIVE
Ketones, UA: NEGATIVE
Leukocytes,UA: NEGATIVE
Nitrite, UA: NEGATIVE
Specific Gravity, UA: 1.015 (ref 1.005–1.030)
Urobilinogen, Ur: 0.2 mg/dL (ref 0.2–1.0)
pH, UA: 6.5 (ref 5.0–7.5)

## 2019-09-21 ENCOUNTER — Ambulatory Visit: Payer: Self-pay | Admitting: Urology

## 2019-10-14 ENCOUNTER — Other Ambulatory Visit: Payer: Self-pay | Admitting: *Deleted

## 2019-10-14 DIAGNOSIS — R3121 Asymptomatic microscopic hematuria: Secondary | ICD-10-CM

## 2019-10-14 NOTE — Progress Notes (Signed)
10/15/2019 4:25 PM   Marcia Moore 04/10/1980 [id number]  Referring provider: Glean Hess, MD [address] [place],  [place] [address] Chief Complaint  Patient presents with  . Follow-up    UA/KUB    HPI: Marcia Moore is a 39 y.o. female who returns for a 3 month follow up of microscopic hematuria and nephrolithiasis.  Her UA from 05/12/19 revealed 10 RBC.  RUS from 05/24/19 revealed small bilateral nonobstructing renal stones w/ no hydronephrosis.   Cystoscopy on 06/15/2019 noted mild trigonitis but no pathologic or worrisome.   KUB today is not radiographic appreciated.   She denies blood in her urine. She is working on her fluid intake. She struggles a lot with animal protein in take.  She has lost some weight after bypass surgery.   No prior hx of kidney stones. FHx of kidney stones.   Never smoker.  She is overall somewhat anxious today about ongoing microscopic blood in her urine as well as her stones.  PMH: Past Medical History:  Diagnosis Date  . Allergy   . Hearing loss    left ear    Surgical History: Past Surgical History:  Procedure Laterality Date  . CESAREAN SECTION    . GASTRIC BYPASS  2012  . INNER EAR SURGERY      Home Medications:  Allergies as of 10/15/2019   No Known Allergies     Medication List       Accurate as of October 15, 2019 11:59 PM. If you have any questions, ask your nurse or doctor.        STOP taking these medications   cholecalciferol 25 MCG (1000 UNIT) tablet Commonly known as: VITAMIN D3 Stopped by: Hollice Espy, MD   COLLAGEN PO Stopped by: Hollice Espy, MD     TAKE these medications   magnesium 30 MG tablet Take 30 mg by mouth 2 (two) times daily.       Allergies: No Known Allergies  Family History: Family History  Problem Relation Age of Onset  . Diabetes Father   . Hypertension Father   . Bone cancer Paternal Grandfather   . Bladder Cancer Neg Hx   . Kidney cancer Neg  Hx   . Prostate cancer Neg Hx     Social History:  reports that she has never smoked. She has never used smokeless tobacco. She reports that she does not drink alcohol and does not use drugs.   Physical Exam: BP 136/87   Pulse (!) 101   Ht 5\' 4"  (1.626 m)   Wt 208 lb (94.3 kg)   BMI 35.70 kg/m   Constitutional:  Alert and oriented, No acute distress. HEENT: [place] AT, moist mucus membranes.  Trachea midline, no masses. Cardiovascular: No clubbing, cyanosis, or edema. Respiratory: Normal respiratory effort, no increased work of breathing. Skin: No rashes, bruises or suspicious lesions. Neurologic: Grossly intact, no focal deficits, moving all 4 extremities. Psychiatric: Normal mood and affect. Anxious.   Laboratory Data:  Lab Results  Component Value Date   CREATININE 0.91 04/14/2019    Urinalysis Results for orders placed or performed during the hospital encounter of 10/15/19  Urinalysis, Complete w Microscopic  Result Value Ref Range   Color, Urine YELLOW YELLOW   APPearance HAZY (A) CLEAR   Specific Gravity, Urine 1.020 1.005 - 1.030   pH 7.5 5.0 - 8.0   Glucose, UA NEGATIVE NEGATIVE mg/dL   Hgb urine dipstick LARGE (A) NEGATIVE   Bilirubin Urine NEGATIVE  NEGATIVE   Ketones, ur NEGATIVE NEGATIVE mg/dL   Protein, ur 30 (A) NEGATIVE mg/dL   Nitrite NEGATIVE NEGATIVE   Leukocytes,Ua TRACE (A) NEGATIVE   Squamous Epithelial / LPF 6-10 0 - 5   WBC, UA 6-10 0 - 5 WBC/hpf   RBC / HPF 21-50 0 - 5 RBC/hpf   Bacteria, UA FEW (A) NONE SEEN     Pertinent Imaging: CLINICAL DATA:  Stone.  Microhematuria.  EXAM: ABDOMEN - 1 VIEW  COMPARISON:  Renal ultrasound 05/24/2019  FINDINGS: No radiographic evidence of urolithiasis. No definite stone seen on radiograph corresponding to that questioned on ultrasound. There are chain sutures in the left upper quadrant. Normal bowel gas pattern. No obstruction. Moderate colonic stool. Included lung bases are clear. No osseous  abnormalities are seen.  IMPRESSION: 1. No radiographic evidence of urolithiasis. 2. Moderate colonic stool burden.   Electronically Signed   By: Keith Rake M.D.   On: 10/15/2019 22:04  KUB was personally reviewed today.  Agree with radiologic interpretation.  No radiographically identifiable stones.+ Agree  Assessment & Plan:    1. Microscopic hematuria UA today.  Recheck UA in 2 years and may consider another work up a that time.  Follow up in 2 years with UA and sooner if she becomes symptomatic.     2. Nephrolithiasis  No recent stone episode.  KUB is normal and I do not recommend any further imaging at this time.   We discussed general stone prevention techniques including drinking plenty water with goal of producing 2.5 L urine daily, increased citric acid intake, avoidance of high oxalate containing foods, and decreased salt intake.  Information about dietary recommendations given today.     [facility] [address] [place], [place] [address] [phone]  I, Selena Batten, am acting as a scribe for Dr. Hollice Espy.  I have reviewed the above documentation for accuracy and completeness, and I agree with the above.   Hollice Espy, MD

## 2019-10-15 ENCOUNTER — Ambulatory Visit
Admission: RE | Admit: 2019-10-15 | Discharge: 2019-10-15 | Disposition: A | Discharge status: Home / Self Care | Payer: Commercial Managed Care - PPO | Source: Ambulatory Visit | Attending: Urology | Admitting: Urology

## 2019-10-15 ENCOUNTER — Ambulatory Visit (INDEPENDENT_AMBULATORY_CARE_PROVIDER_SITE_OTHER): Payer: Commercial Managed Care - PPO | Admitting: Urology

## 2019-10-15 ENCOUNTER — Encounter: Payer: Self-pay | Admitting: Urology

## 2019-10-15 ENCOUNTER — Other Ambulatory Visit
Admission: RE | Admit: 2019-10-15 | Discharge: 2019-10-15 | Disposition: A | Discharge status: Home / Self Care | Payer: Commercial Managed Care - PPO | Source: Home / Self Care | Attending: Urology | Admitting: Urology

## 2019-10-15 ENCOUNTER — Other Ambulatory Visit: Payer: Self-pay

## 2019-10-15 ENCOUNTER — Ambulatory Visit
Admission: RE | Admit: 2019-10-15 | Discharge: 2019-10-15 | Disposition: A | Discharge status: Home / Self Care | Payer: Commercial Managed Care - PPO | Attending: Urology | Admitting: Urology

## 2019-10-15 VITALS — BP 136/87 | HR 101 | Ht 64.0 in | Wt 208.0 lb

## 2019-10-15 DIAGNOSIS — R3121 Asymptomatic microscopic hematuria: Secondary | ICD-10-CM

## 2019-10-15 DIAGNOSIS — N200 Calculus of kidney: Secondary | ICD-10-CM | POA: Diagnosis not present

## 2019-10-15 LAB — URINALYSIS, COMPLETE (UACMP) WITH MICROSCOPIC
Bilirubin Urine: NEGATIVE
Glucose, UA: NEGATIVE mg/dL
Ketones, ur: NEGATIVE mg/dL
Nitrite: NEGATIVE
Protein, ur: 30 mg/dL — AB
Specific Gravity, Urine: 1.02 (ref 1.005–1.030)
pH: 7.5 (ref 5.0–8.0)

## 2019-10-26 ENCOUNTER — Other Ambulatory Visit: Payer: Commercial Managed Care - PPO

## 2019-12-30 ENCOUNTER — Other Ambulatory Visit: Payer: Self-pay

## 2019-12-30 ENCOUNTER — Encounter: Payer: Self-pay | Admitting: Emergency Medicine

## 2019-12-30 ENCOUNTER — Ambulatory Visit
Admission: EM | Admit: 2019-12-30 | Discharge: 2019-12-30 | Disposition: A | Discharge status: Home / Self Care | Payer: Commercial Managed Care - PPO | Attending: Physician Assistant | Admitting: Physician Assistant

## 2019-12-30 DIAGNOSIS — J101 Influenza due to other identified influenza virus with other respiratory manifestations: Secondary | ICD-10-CM | POA: Diagnosis not present

## 2019-12-30 DIAGNOSIS — H9201 Otalgia, right ear: Secondary | ICD-10-CM | POA: Diagnosis not present

## 2019-12-30 DIAGNOSIS — Z20822 Contact with and (suspected) exposure to covid-19: Secondary | ICD-10-CM | POA: Insufficient documentation

## 2019-12-30 DIAGNOSIS — R0981 Nasal congestion: Secondary | ICD-10-CM | POA: Diagnosis not present

## 2019-12-30 DIAGNOSIS — Z20828 Contact with and (suspected) exposure to other viral communicable diseases: Secondary | ICD-10-CM | POA: Diagnosis not present

## 2019-12-30 DIAGNOSIS — R5383 Other fatigue: Secondary | ICD-10-CM | POA: Diagnosis present

## 2019-12-30 LAB — RESP PANEL BY RT-PCR (FLU A&B, COVID) ARPGX2
Influenza A by PCR: POSITIVE — AB
Influenza B by PCR: NEGATIVE
SARS Coronavirus 2 by RT PCR: NEGATIVE

## 2019-12-30 MED ORDER — OSELTAMIVIR PHOSPHATE 75 MG PO CAPS: 75.0000 mg | ORAL_CAPSULE | Freq: Two times a day (BID) | ORAL | 0 refills | Status: AC

## 2019-12-30 NOTE — ED Triage Notes (Signed)
Patient in today c/o fatigue, nasal congestion, headache, right ear pain and body aches x today. Patient's daughter tested positive for flu on Monday (12/27/19). Patient has taken OTC allergy medication. Patient has had the covid vaccines.

## 2019-12-30 NOTE — Discharge Instructions (Addendum)
FLU: Stressed the need to increase rest and fluid/electrolyte intake . Use medications as directed including Tamiflu and OTC cough medications. If breathing becomes a concern follow back up with [place] or go to the ER. Avoid others and do not return to work/school too soon in order to prevent others from becoming infected. Most get better within 1-2 wks. Call PCP if  trouble breathing or are short of breath, feel pain or pressure in your chest or abdomen, get dizzy, feel confused, or have vomiting .   Your eardrum on the right side is a little bit red.  If the ear pain worsens or you develop fever, consider following up with PCP or our department to reexamine your ear.  If pain is getting worse you may have an ear infection and need an antibiotic.  I do not believe you need one at this point.

## 2019-12-30 NOTE — ED Provider Notes (Signed)
[facility]    CSN: [id number] Arrival date & time: 12/30/19  1755      History   Chief Complaint Chief Complaint  Patient presents with   Fatigue   Otalgia    right   Nasal Congestion   Headache   Generalized Body Aches    HPI Marcia Moore is a 39 y.o. female presenting for onset of fatigue, body aches, headaches, nasal congestion, mild cough, and right-sided ear pain today.  Patient states that her daughter recently tested positive for influenza 3 days ago.  Patient denies any known COVID-19 exposure and has been fully vaccinated for COVID-19.  She denies any associated fever, chills, sweats, sinus pain, sore throat.  Patient denies any chest discomfort, breathing difficulty, abdominal pain, nausea/vomiting/diarrhea.  She has been taking over-the-counter decongestants.  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  She has a hearing aid for the right ear.  Denies any history of cardiopulmonary disease.  No other major medical problems.  No other complaints or concerns today.  HPI  Past Medical History:  Diagnosis Date   Allergy    Hearing loss    left ear    Patient Active Problem List   Diagnosis Date Noted   Mild hyperlipidemia 04/15/2019   Asymptomatic microscopic hematuria 04/14/2019   Fibrocystic breast changes of both breasts 04/14/2019   Status post gastric bypass for obesity 03/02/2019   Lipoma of left upper extremity 03/02/2019    Past Surgical History:  Procedure Laterality Date   CESAREAN SECTION     GASTRIC BYPASS  2012   INNER EAR SURGERY      OB History   No obstetric history on file.      Home Medications    Prior to Admission medications   Medication Sig Start Date End Date Taking? Authorizing Provider  magnesium 30 MG tablet Take 30 mg by mouth 2 (two) times daily.   Yes [provider]  oseltamivir (TAMIFLU) 75 MG capsule Take 1 capsule  (75 mg total) by mouth every 12 (twelve) hours for 5 days. 12/30/19 01/04/20  Danton Clap, PA-C    Family History Family History  Problem Relation Age of Onset   Depression Mother    Diabetes Father    Hypertension Father    Other Father        suicide   Bone cancer Paternal Grandfather    Bladder Cancer Neg Hx    Kidney cancer Neg Hx    Prostate cancer Neg Hx     Social History Social History   Tobacco Use   Smoking status: Never Smoker   Smokeless tobacco: Never Used  Vaping Use   Vaping Use: Never used  Substance Use Topics   Alcohol use: Never   Drug use: Never     Allergies   Patient has no known allergies.   Review of Systems Review of Systems  Constitutional: Positive for fatigue. Negative for chills, diaphoresis and fever.  HENT: Positive for congestion, ear pain and rhinorrhea. Negative for sinus pressure, sinus pain and sore throat.   Respiratory: Positive for cough. Negative for shortness of breath.   Gastrointestinal: Negative for abdominal pain, nausea and vomiting.  Musculoskeletal: Positive for myalgias. Negative for arthralgias.  Skin: Negative for rash.  Neurological: Positive for headaches. Negative for weakness.  Hematological: Negative for adenopathy.     Physical Exam Triage Vital Signs ED Triage Vitals  Enc Vitals  Group     BP 12/30/19 1919 (!) 136/92     Pulse Rate 12/30/19 1919 (!) 120     Resp 12/30/19 1919 18     Temp 12/30/19 1919 98.3 F (36.8 C)     Temp Source 12/30/19 1919 Oral     SpO2 12/30/19 1919 100 %     Weight 12/30/19 1921 220 lb (99.8 kg)     Height 12/30/19 1921 5\' 3"  (1.6 m)     Head Circumference --      Peak Flow --      Pain Score 12/30/19 1920 4     Pain Loc --      Pain Edu? --      Excl. in [place]? --    No data found.  Updated Vital Signs BP (!) 136/92 (BP Location: Left Arm)    Pulse (!) 120    Temp 98.3 F (36.8 C) (Oral)    Resp 18    Ht 5\' 3"  (1.6 m)    Wt 220 lb (99.8 kg)    LMP  12/09/2019 (Approximate)    SpO2 100%    BMI 38.97 kg/m       Physical Exam Vitals and nursing note reviewed.  Constitutional:      General: She is not in acute distress.    Appearance: Normal appearance. She is not ill-appearing or toxic-appearing.  HENT:     Head: Normocephalic and atraumatic.     Right Ear: Ear canal and external ear normal. A middle ear effusion is present. Tympanic membrane is scarred and erythematous (mildly erythematous lower 30% of TM with slight bulging in this one area).     Left Ear: Ear canal and external ear normal. Tympanic membrane is scarred.     Nose: Congestion and rhinorrhea present.     Mouth/Throat:     Mouth: Mucous membranes are moist.     Pharynx: Oropharynx is clear. No posterior oropharyngeal erythema.  Eyes:     General: No scleral icterus.       Right eye: No discharge.        Left eye: No discharge.     Conjunctiva/sclera: Conjunctivae normal.  Cardiovascular:     Rate and Rhythm: Regular rhythm. Tachycardia present.     Heart sounds: Normal heart sounds.  Pulmonary:     Effort: Pulmonary effort is normal. No respiratory distress.     Breath sounds: Normal breath sounds. No wheezing, rhonchi or rales.  Musculoskeletal:     Cervical back: Neck supple.  Skin:    General: Skin is dry.  Neurological:     General: No focal deficit present.     Mental Status: She is alert. Mental status is at baseline.     Motor: No weakness.     Gait: Gait normal.  Psychiatric:        Mood and Affect: Mood normal.        Behavior: Behavior normal.        Thought Content: Thought content normal.      UC Treatments / Results  Labs (all labs ordered are listed, but only abnormal results are displayed) Labs Reviewed  RESP PANEL BY RT-PCR (FLU A&B, COVID) [id number]    EKG   Radiology No results found.  Procedures Procedures (including critical care time)  Medications Ordered in UC Medications - No data to display  Initial Impression /  Assessment and Plan / UC Course  I have reviewed the triage vital signs and the  nursing notes.  Pertinent labs & imaging results that were available during my care of the patient were reviewed by me and considered in my medical decision making (see chart for details).   39 year old female presenting for onset of fatigue, body aches, cough, congestion, and right-sided ear pain today.  Patient exposed to daughter with influenza.  Respiratory panel obtained.  Advised patient we will call with results, but it is most likely that she does have influenza given her symptoms and exposure.  I have sent Tamiflu and advised weight of care with increasing rest and fluids.  Advised for her to seek reexamination if she has increased right ear pain.  Patient states that the pain is mild right now.  Patient declined any cough syrups or nasal sprays.  Advised her to continue with over-the-counter Mucinex or Robitussin and Flonase.  ED precautions reviewed with patient.  Influenza A test positive.  Discussed results with patient.  Final Clinical Impressions(s) / UC Diagnoses   Final diagnoses:  Viral illness  Exposure to the flu  Fatigue, unspecified type  Nasal congestion  Right ear pain     Discharge Instructions     FLU: Stressed the need to increase rest and fluid/electrolyte intake . Use medications as directed including Tamiflu and OTC cough medications. If breathing becomes a concern follow back up with [place] or go to the ER. Avoid others and do not return to work/school too soon in order to prevent others from becoming infected. Most get better within 1-2 wks. Call PCP if  trouble breathing or are short of breath, feel pain or pressure in your chest or abdomen, get dizzy, feel confused, or have vomiting .   Your eardrum on the right side is a little bit red.  If the ear pain worsens or you develop fever, consider following up with PCP or our department to reexamine your ear.  If pain is getting worse you  may have an ear infection and need an antibiotic.  I do not believe you need one at this point.    ED Prescriptions    Medication Sig Dispense Auth. Provider   oseltamivir (TAMIFLU) 75 MG capsule Take 1 capsule (75 mg total) by mouth every 12 (twelve) hours for 5 days. 10 capsule Danton Clap, PA-C     PDMP not reviewed this encounter.   Danton Clap, PA-C 12/30/19 2023

## 2020-02-07 ENCOUNTER — Telehealth: Payer: Self-pay

## 2020-02-07 ENCOUNTER — Ambulatory Visit
Admission: EM | Admit: 2020-02-07 | Discharge: 2020-02-07 | Disposition: A | Discharge status: Home / Self Care | Payer: BC Managed Care – PPO

## 2020-02-07 ENCOUNTER — Other Ambulatory Visit: Payer: Self-pay

## 2020-02-07 DIAGNOSIS — H66012 Acute suppurative otitis media with spontaneous rupture of ear drum, left ear: Secondary | ICD-10-CM | POA: Diagnosis not present

## 2020-02-07 MED ORDER — CEFDINIR 300 MG PO CAPS: 300.0000 mg | ORAL_CAPSULE | Freq: Two times a day (BID) | ORAL | 0 refills | Status: AC

## 2020-02-07 NOTE — ED Provider Notes (Signed)
[facility]    CSN: [id number] Arrival date & time: 02/07/20  1505      History   Chief Complaint Chief Complaint  Patient presents with  . Ear Pain    L  . Covid Positive    HPI Marcia Moore is a 40 y.o. female.   HPI   40 year old female who was Covid positive presents for evaluation of pain and drainage from her left ear.  Patient has a longstanding history of ear infections and has had multiple surgeries on her left ear.  Patient states that she is used Cipro drops in the past to treat infections but her drops have expired and her ENT will not give her a refill without an appointment.  Patient denies fever or changes in her hearing.  Past Medical History:  Diagnosis Date  . Allergy   . Hearing loss    left ear    Patient Active Problem List   Diagnosis Date Noted  . Mild hyperlipidemia 04/15/2019  . Asymptomatic microscopic hematuria 04/14/2019  . Fibrocystic breast changes of both breasts 04/14/2019  . Status post gastric bypass for obesity 03/02/2019  . Lipoma of left upper extremity 03/02/2019    Past Surgical History:  Procedure Laterality Date  . CESAREAN SECTION    . GASTRIC BYPASS  2012  . INNER EAR SURGERY      OB History   No obstetric history on file.      Home Medications    Prior to Admission medications   Medication Sig Start Date End Date Taking? Authorizing Provider  cefdinir (OMNICEF) 300 MG capsule Take 1 capsule (300 mg total) by mouth 2 (two) times daily for 10 days. 02/07/20 02/17/20 Yes Margarette Canada, NP  magnesium 30 MG tablet Take 30 mg by mouth 2 (two) times daily.   Yes [provider]  Multiple Vitamin (MULTIVITAMIN) capsule Take 1 capsule by mouth daily.   Yes [provider]  Multiple Vitamins-Minerals (ZINC PO) Take by mouth.   Yes [provider]  Omega-3 Fatty Acids (FISH OIL PO) Take by mouth.   Yes [provider]  Probiotic Product (PROBIOTIC PO) Take by mouth.   Yes  [provider]    Family History Family History  Problem Relation Age of Onset  . Depression Mother   . Diabetes Father   . Hypertension Father   . Other Father        suicide  . Bone cancer Paternal Grandfather   . Bladder Cancer Neg Hx   . Kidney cancer Neg Hx   . Prostate cancer Neg Hx     Social History Social History   Tobacco Use  . Smoking status: Never Smoker  . Smokeless tobacco: Never Used  Vaping Use  . Vaping Use: Never used  Substance Use Topics  . Alcohol use: Never  . Drug use: Never     Allergies   Patient has no known allergies.   Review of Systems Review of Systems  Constitutional: Negative for fever.  HENT: Positive for ear discharge and ear pain. Negative for hearing loss.      Physical Exam Triage Vital Signs ED Triage Vitals  Enc Vitals Group     BP 02/07/20 1619 (!) 157/107     Pulse Rate 02/07/20 1619 (!) 102     Resp 02/07/20 1619 18     Temp 02/07/20 1619 98.6 F (37 C)     Temp Source 02/07/20 1619 Oral     SpO2  02/07/20 1619 100 %     Weight --      Height --      Head Circumference --      Peak Flow --      Pain Score 02/07/20 1610 0     Pain Loc --      Pain Edu? --      Excl. in [place]? --    No data found.  Updated Vital Signs BP (!) 157/107 (BP Location: Left Arm)   Pulse (!) 102   Temp 98.6 F (37 C) (Oral)   Resp 18   LMP 01/31/2020   SpO2 100%   Visual Acuity Right Eye Distance:   Left Eye Distance:   Bilateral Distance:    Right Eye Near:   Left Eye Near:    Bilateral Near:     Physical Exam Vitals and nursing note reviewed.  Constitutional:      General: She is not in acute distress.    Appearance: Normal appearance. She is not toxic-appearing.  HENT:     Head: Normocephalic and atraumatic.     Right Ear: Tympanic membrane, ear canal and external ear normal.     Left Ear: Ear canal and external ear normal.     Ears:     Comments: Left tympanic membrane is erythematous and injected  with a mixture of serous drainage and thick, curd-like gray discharge and debris.  Unable to visualize a hole in the tympanic membrane secondary to inflammation and discharge and external auditory canal. Skin:    General: Skin is warm and dry.     Capillary Refill: Capillary refill takes less than 2 seconds.     Findings: No erythema or rash.  Neurological:     General: No focal deficit present.     Mental Status: She is alert and oriented to person, place, and time.  Psychiatric:        Mood and Affect: Mood normal.        Behavior: Behavior normal.        Thought Content: Thought content normal.        Judgment: Judgment normal.      UC Treatments / Results  Labs (all labs ordered are listed, but only abnormal results are displayed) Labs Reviewed - No data to display  EKG   Radiology No results found.  Procedures Procedures (including critical care time)  Medications Ordered in UC Medications - No data to display  Initial Impression / Assessment and Plan / UC Course  I have reviewed the triage vital signs and the nursing notes.  Pertinent labs & imaging results that were available during my care of the patient were reviewed by me and considered in my medical decision making (see chart for details).   Patient presents for evaluation of drainage and pain in her left ear.  Patient has a longstanding history of multiple surgeries and infections to her left ear.  Patient denies any changes in her hearing but admits that she has minimal hearing in her left ear as result of her previous surgeries and infections.  The left tympanic membrane is erythematous and injected with a mixture of thick purulent and liquid discharge.  Patient's exam is consistent with otitis media.  Due to patient's longstanding history of ear infections and surgeries will place patient on cefdinir twice daily for 10 days.  Patient has a follow-up appointment on February 2 with her ear nose and throat  doctor.   Final Clinical Impressions(s) /  UC Diagnoses   Final diagnoses:  Acute suppurative otitis media of left ear with spontaneous rupture of tympanic membrane, recurrence not specified     Discharge Instructions     Take the cefdinir twice daily with food for 10 days for treatment of your ear infection.  Take an over-the-counter probiotic, such as Culturelle or align, 1 hour after each dose of antibiotic to prevent diarrhea from occurring.  Keep your follow-up appointment with your ear nose and throat doctor as previously scheduled.    ED Prescriptions    Medication Sig Dispense Auth. Provider   cefdinir (OMNICEF) 300 MG capsule Take 1 capsule (300 mg total) by mouth 2 (two) times daily for 10 days. 20 capsule Margarette Canada, NP     PDMP not reviewed this encounter.   Margarette Canada, NP 02/07/20 1644

## 2020-02-07 NOTE — ED Triage Notes (Signed)
Pt reports infection in L ear.  Pain and drainage.  Popped yesterday and pain has improved some but still drainage.  Has a hx of ear infections.  Pt has taken Cipro drops to treat the past infections.  Drops are expired and ENT won't refill without appt.    (Pt COVID+ 02/04/2020)  S/s improving.

## 2020-02-07 NOTE — Telephone Encounter (Signed)
Copied from [place] [id number]. Topic: General - Other >> Feb 07, 2020 12:07 PM Erick Blinks wrote: [phone] Pt is requesting a call back from the nurse, has what she believes is an ear infection in her left ear. She has reached out to her ENT but has been refused since she hasnt been seen. Currently has covid  Requesting a call back  She has the name of the ear drops that she has been prescribed  (Ciprodex ear drops she says)

## 2020-02-07 NOTE — Telephone Encounter (Signed)
Spoke to pt told her to go to UC. Pt currently has covid. Pt verbalized understanding.  KP

## 2020-02-07 NOTE — Discharge Instructions (Addendum)
Take the cefdinir twice daily with food for 10 days for treatment of your ear infection.  Take an over-the-counter probiotic, such as Culturelle or align, 1 hour after each dose of antibiotic to prevent diarrhea from occurring.  Keep your follow-up appointment with your ear nose and throat doctor as previously scheduled.

## 2020-06-02 IMAGING — CR DG KNEE COMPLETE 4+V*L*
5 series · 5 of 5 positions shown · non-contrast
Comparison: None.

CLINICAL DATA: Left knee pain for 2 days with swelling. No reported
injury.

EXAM:
LEFT KNEE - COMPLETE 4+ VIEW

[knee ap]
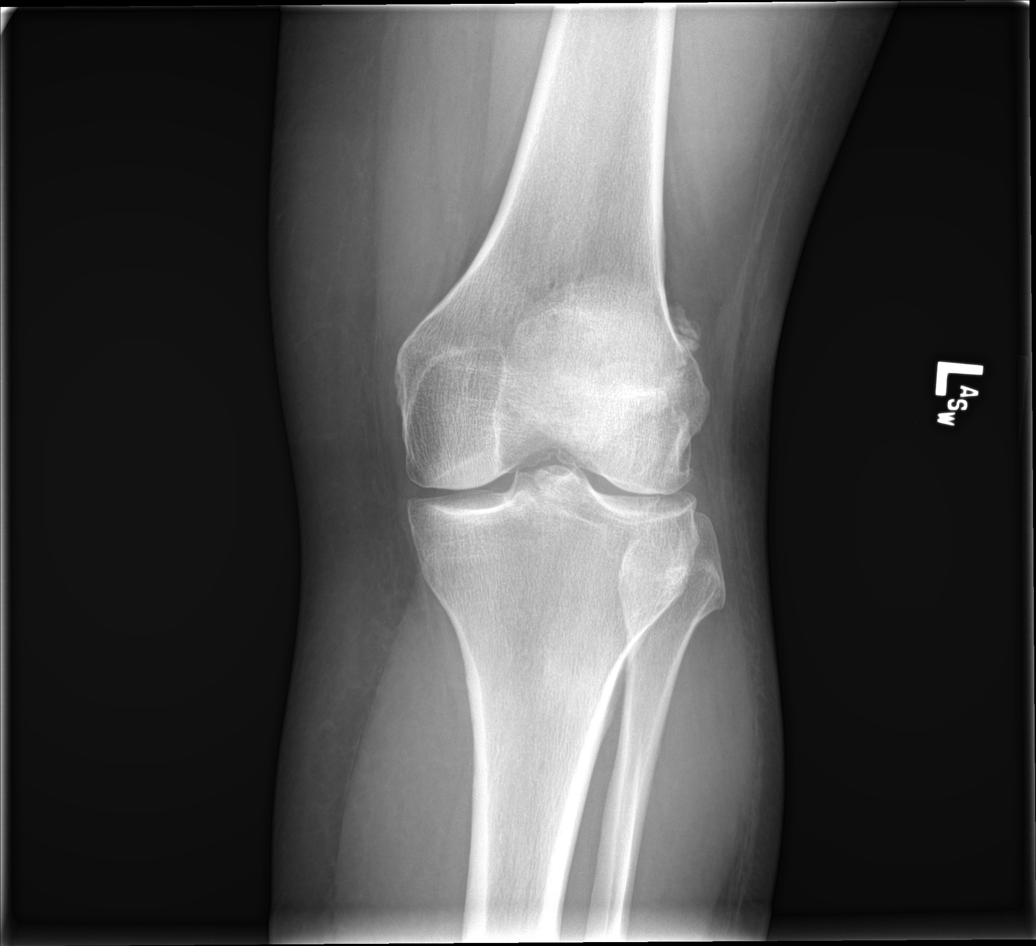

[knee obl (1 of 3)]
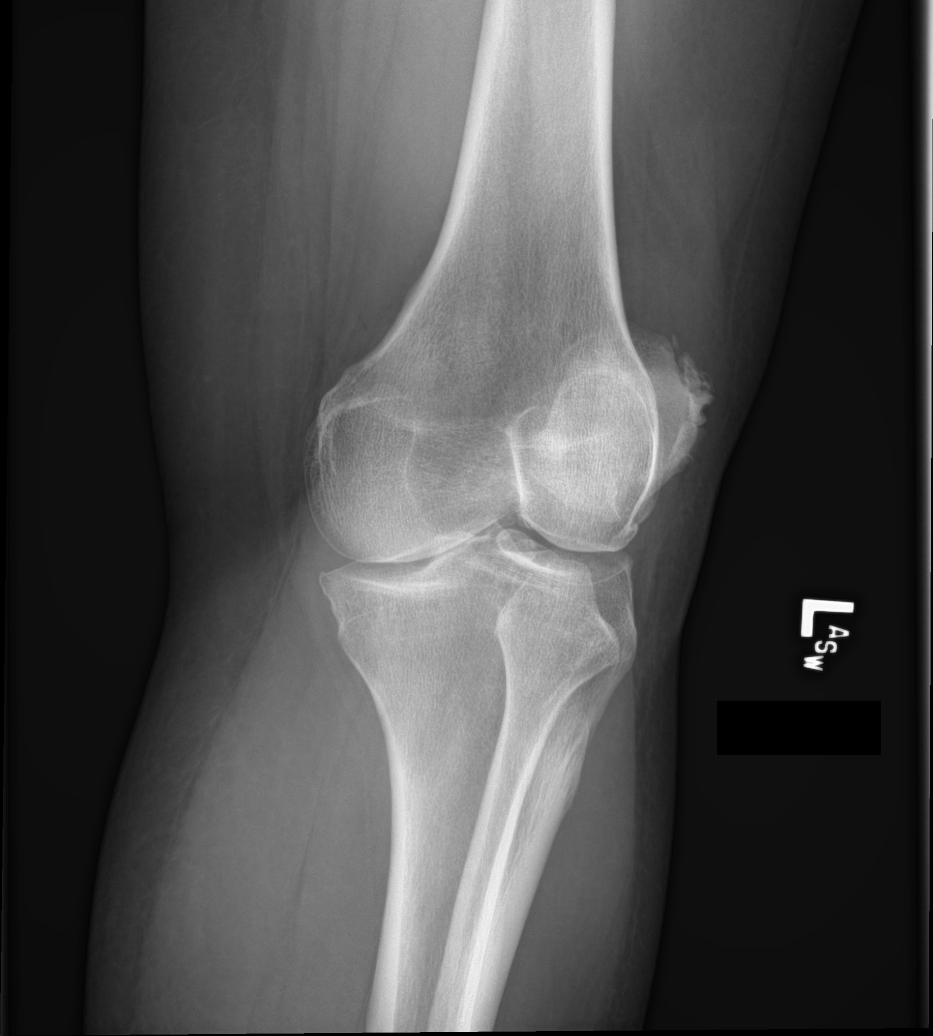

[knee obl (2 of 3)]
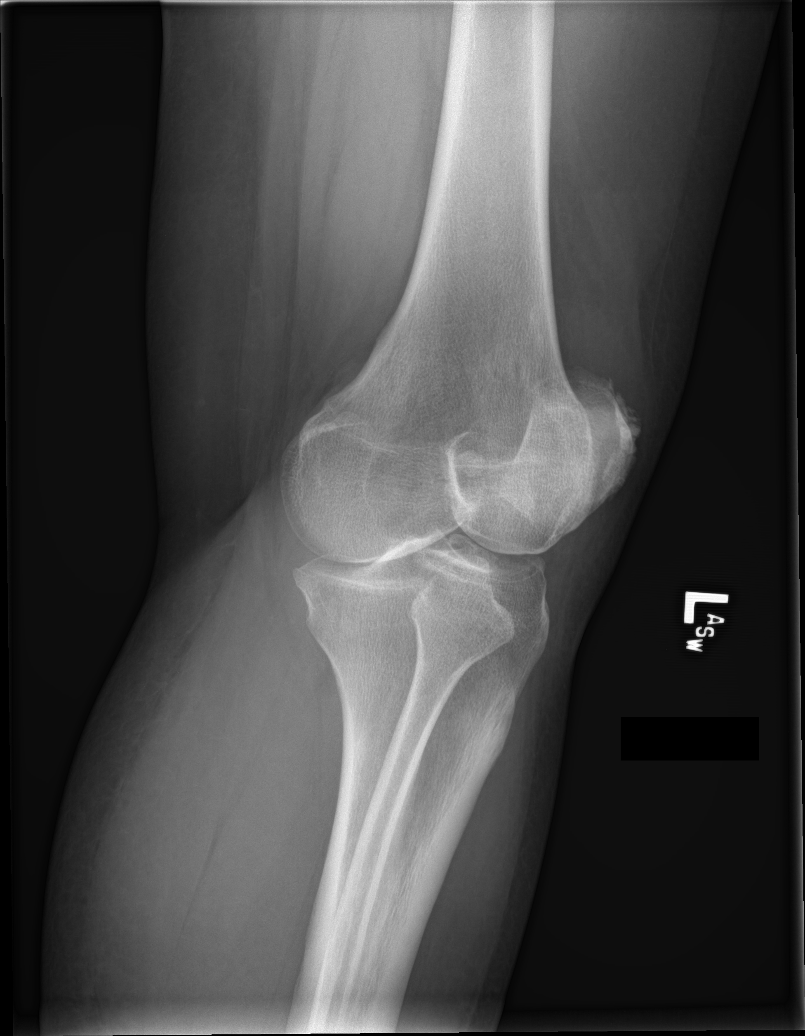

[knee obl (3 of 3)]
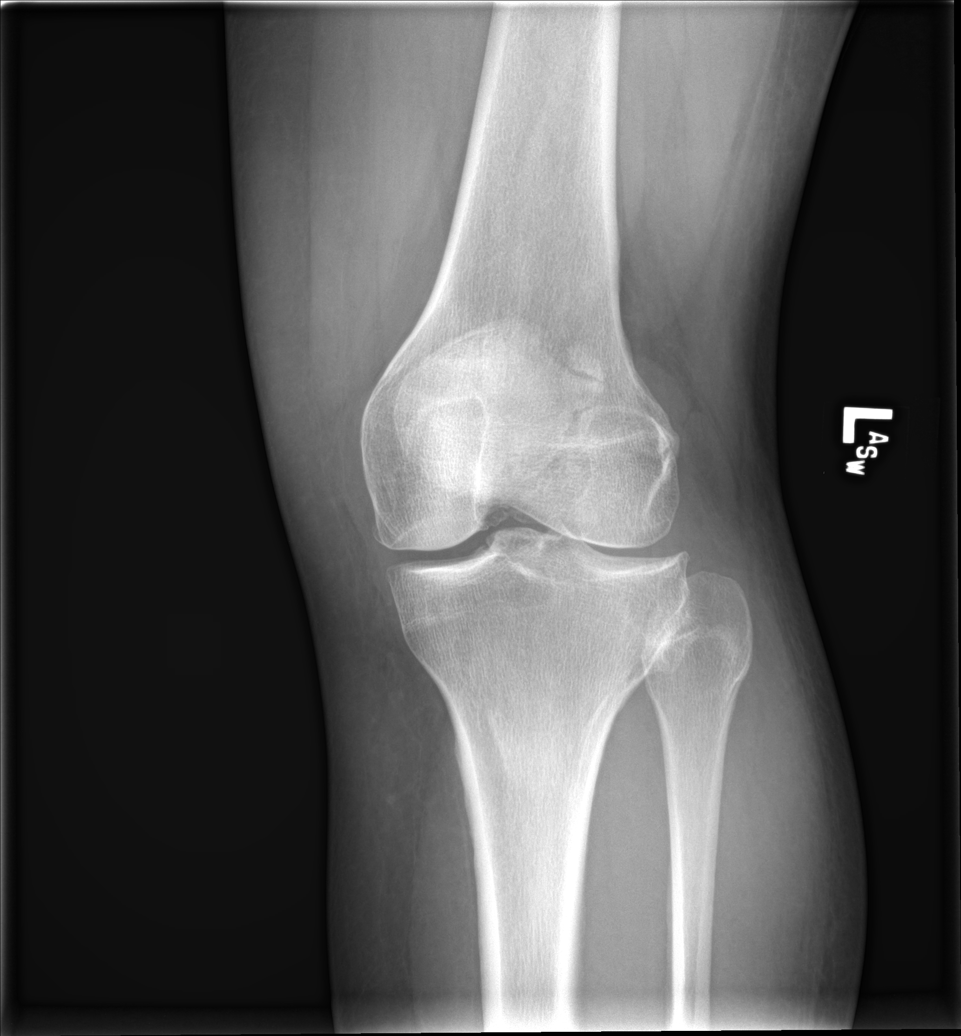

[knee lat]
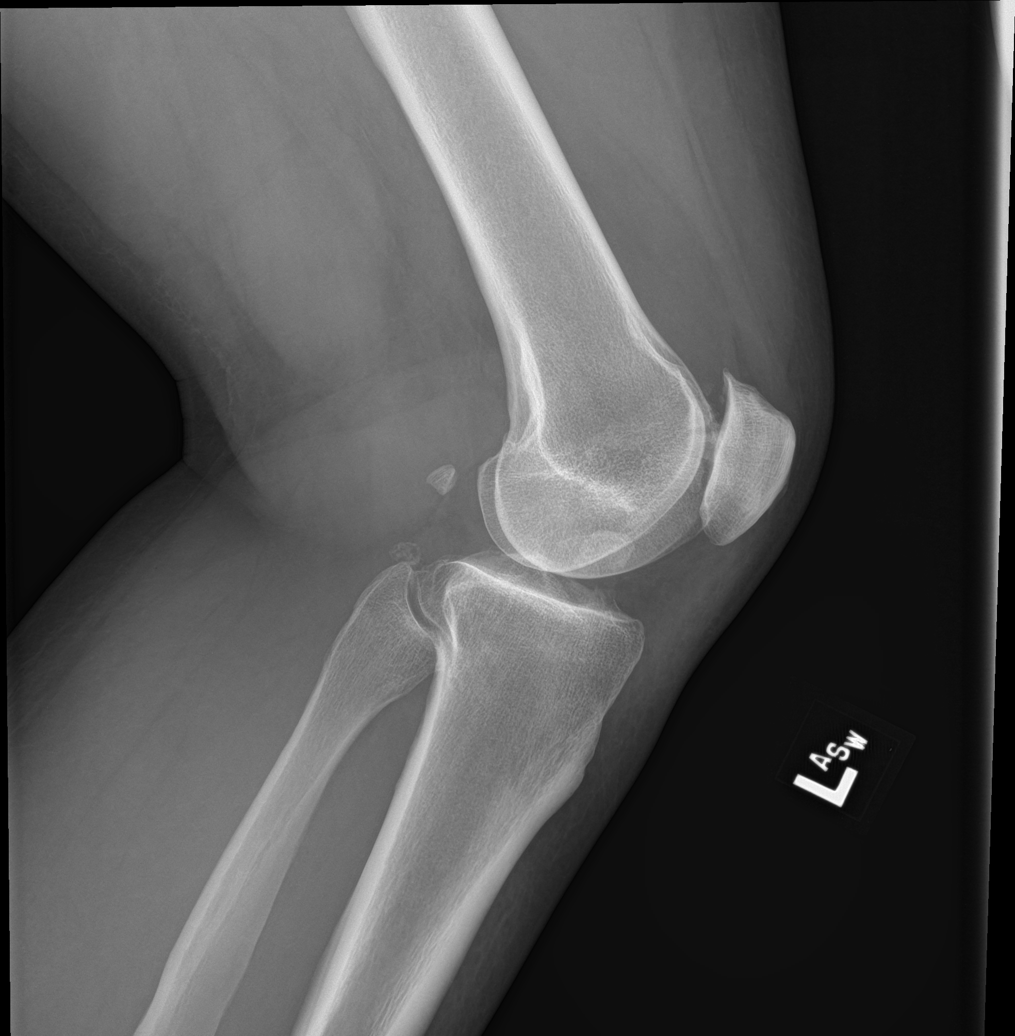

[5 of 5 positions shown; findings below may reference images not displayed]

FINDINGS: No fracture or dislocation. Small suprapatellar left knee joint
effusion. Mild tricompartmental left knee osteoarthritis. Small
superolateral left patellar enthesophytes. No suspicious focal
osseous lesions. No radiopaque foreign bodies.
IMPRESSION: Small suprapatellar left knee joint effusion. Mild tricompartmental
left knee osteoarthritis. Small superolateral left patellar
enthesophytes. No fracture or dislocation.

## 2022-08-16 ENCOUNTER — Ambulatory Visit: Payer: Self-pay

## 2022-08-16 NOTE — Telephone Encounter (Signed)
Chief Complaint: Flank pain Symptoms: mid back right side pain and spasm  Frequency: comes and goes  Pertinent Negatives: Patient denies pain with urination, blood in urine, fever Disposition: [] ED /[x] Urgent Care (no appt availability in office) / [] Appointment(In office/virtual)/ []  [facility]/ [] Home Care/ [] Refused Recommended Disposition /[] [place] [facility]/ []  Follow-up with PCP Additional Notes: Patient stated that she started having back spasms about 2 weeks ago. Patient reports spasm with a 8/10 before trying over the counter treatment. Patient reports that the spasms only occur with movement and with pain medication the spasms are a 5/10 on pain scale. Patient states she has an appointment scheduled with urology on Monday. Advised patient to keep urology appointment and if symptoms get worse to go to UC or the ED. Patient verbalized understanding.  Summary: experiencing back spasms going on for a few months.   Pt stated she is experiencing back spasms going on for a few months. She stated her pain is 5-10.  Pt needs to re-establish care as she hasn't been seen in office since 04/14/2019.  Scheduled next available for a new patient with Dr. Corinne Ports.  Seeking clinical advice.     Reason for Disposition  [1] MILD pain (i.e., scale 1-3; does not interfere with normal activities) AND [2] present > 3 days  Answer Assessment - Initial Assessment Questions 1. LOCATION: "Where does it hurt?" (e.g., left, right)     Midback on the right side  2. ONSET: "When did the pain start?"     Sometime in July maybe around [facility] July  3. SEVERITY: "How bad is the pain?" (e.g., Scale 1-10; mild, moderate, or severe)   - MILD (1-3): doesn't interfere with normal activities    - MODERATE (4-7): interferes with normal activities or awakens from sleep    - SEVERE (8-10): excruciating pain and patient unable to do normal activities (stays in bed)       5/10 with movement and treatment   4. PATTERN: "Does the pain come and go, or is it constant?"      Comes and goes  5. CAUSE: "What do you think is causing the pain?"     I'm not sure maybe a kidney infection but I feel like it is better with over the counter treatment  6. OTHER SYMPTOMS:  "Do you have any other symptoms?" (e.g., fever, abdomen pain, vomiting, leg weakness, burning with urination, blood in urine)     No  Protocols used: Flank Pain-A-AH

## 2022-08-16 NOTE — Progress Notes (Signed)
08/19/2022 4:20 PM   Marcia Moore 1980/06/10 [id number]  Referring provider: Reubin Milan, MD [address] [place],  [place] [address]  Urological history: 1. Nephrolithiasis -RUS (05/2019) small bilateral non obstructing renal stones -KUB (10/2019) no stones seen   2. Intermediate risk hematuria -non-smoker -RUS (05/2019) small bilateral non obstructing renal stones -cysto (06/2019) mild trigonitis   Chief Complaint  Patient presents with   Nephrolithiasis   HPI: Marcia Moore is a 42 y.o. female who presents today for possible stones.  Previous records reviewed.   She has been having intermittent right flank pain that radiates to the right side for the last 2 to 3 weeks.  She saw her chiropractor this morning and feels better.  She has also noticed that her urine has been darker.  Patient denies any modifying or aggravating factors.  Patient denies any recent UTI's, gross hematuria, dysuria or suprapubic pain.  Patient denies any fevers, chills, nausea or vomiting.    KUB 1.5 cm calcification in the vicinity of the right renal pelvis.   UA yellow clear, specific gravity 1.015, pH 5.5, large heme, 100 protein, 0-5 squames, 0-5 WBC's, 11-20 RBC's, rare bacteria and WBC casts present.    PMH: Past Medical History:  Diagnosis Date   Allergy    Hearing loss    left ear   Kidney stone     Surgical History: Past Surgical History:  Procedure Laterality Date   CESAREAN SECTION     GASTRIC BYPASS  2012   INNER EAR SURGERY      Home Medications:  Allergies as of 08/19/2022   No Known Allergies      Medication List        Accurate as of August 19, 2022  4:20 PM. If you have any questions, ask your nurse or doctor.          FISH OIL PO Take by mouth.   magnesium 30 MG tablet Take 30 mg by mouth 2 (two) times daily.   multivitamin capsule Take 1 capsule by mouth daily.   PROBIOTIC PO Take by mouth.   ZINC PO Take by mouth.         Allergies: No Known Allergies  Family History: Family History  Problem Relation Age of Onset   Depression Mother    Diabetes Father    Hypertension Father    Other Father        suicide   Bone cancer Paternal Grandfather    Bladder Cancer Neg Hx    Kidney cancer Neg Hx    Prostate cancer Neg Hx     Social History:  reports that she has never smoked. She has never used smokeless tobacco. She reports that she does not drink alcohol and does not use drugs.  ROS: Pertinent ROS in HPI  Physical Exam: BP (!) 153/94 (BP Location: Left Arm, Patient Position: Sitting, Cuff Size: Large)   Pulse 94   Ht 5\' 4"  (1.626 m)   Wt 250 lb (113.4 kg)   LMP 07/29/2022 Comment: denies preg, signed preg waiver  BMI 42.91 kg/m   Constitutional:  Well nourished. Alert and oriented, No acute distress. HEENT: [place] AT, moist mucus membranes.  Trachea midline Cardiovascular: No clubbing, cyanosis, or edema. Respiratory: Normal respiratory effort, no increased work of breathing. Neurologic: Grossly intact, no focal deficits, moving all 4 extremities. Psychiatric: Normal mood and affect.    Laboratory Data: Urinalysis Component     Latest Ref Rng 08/19/2022  Appearance     CLEAR  CLEAR   WBC Casts, UA PRESENT   Bacteria, UA     NONE SEEN  RARE !   Glucose, UA     NEGATIVE mg/dL NEGATIVE   WBC, UA     0 - 5 WBC/hpf 0-5   Color, Urine     YELLOW  YELLOW   Specific Gravity, Urine     1.005 - 1.030  1.015   pH     5.0 - 8.0  5.5   Hgb urine dipstick     NEGATIVE  LARGE !   Bilirubin Urine     NEGATIVE  NEGATIVE   Ketones, ur     NEGATIVE mg/dL NEGATIVE   Protein     NEGATIVE mg/dL 161 !   Nitrite     NEGATIVE  NEGATIVE   Leukocytes,Ua     NEGATIVE  NEGATIVE   Squamous Epithelial / HPF     0 - 5 /HPF 0-5   RBC / HPF     0 - 5 RBC/hpf 11-20     Legend: ! Abnormal I have reviewed the labs.   Pertinent Imaging: CLINICAL DATA:  hx of kidney stones   EXAM: ABDOMEN - 1 VIEW    COMPARISON:  X-ray abdomen 10/15/2019   FINDINGS: The bowel gas pattern is normal. 2.1 cm calcification overlying the expected region of the renal shadow may represent nephrolithiasis.   IMPRESSION: 1. A 2.1 cm calcification overlying the expected region of the renal shadow may represent nephrolithiasis. Differential diagnosis includes cholelithiasis. 2. Nonobstructive bowel gas pattern     Electronically Signed   By: Tish Frederickson M.D.   On: 08/25/2022 01:23   I have independently reviewed the films.   See HPI.  Assessment & Plan:    1. Nephrolithiasis -UA w/ micro heme -urine sent for culture -results of KUB reviewed with patient and I recommended that she undergo a CT scan for further evaluation to plan for definitive stone management -she is hesitant to undergo a CT cited the large amount of radiation involved and she is anxious about undergoing surgery -explained that we could give her time to accept these findings and there is a small possibility it is not a stone, but something else and the CT scan can further evaluate the finding on KUB -she will return next week for follow up KUB to see if the radiopaque area persists making it more likely a stone - she will call if she decides to proceed with the CT before next week -return precaution reviewed  2. Microscopic hematuria -UA w/ micro heme -urine culture pending -likely to a stone -will recheck once stone is addressed to ensure micro heme clears   Return in about 1 week (around 08/26/2022).  These notes generated with voice recognition software. I apologize for typographical errors.  Cloretta Ned  [facility] [address]  [address] [place], [place] [address] [phone]

## 2022-08-19 ENCOUNTER — Other Ambulatory Visit
Admission: RE | Admit: 2022-08-19 | Discharge: 2022-08-19 | Disposition: A | Discharge status: Home / Self Care | Payer: BC Managed Care – PPO | Source: Home / Self Care | Attending: Urology | Admitting: Urology

## 2022-08-19 ENCOUNTER — Ambulatory Visit
Admission: RE | Admit: 2022-08-19 | Discharge: 2022-08-19 | Disposition: A | Discharge status: Home / Self Care | Payer: BC Managed Care – PPO | Attending: Urology | Admitting: Urology

## 2022-08-19 ENCOUNTER — Encounter: Payer: Self-pay | Admitting: Urology

## 2022-08-19 ENCOUNTER — Ambulatory Visit
Admission: RE | Admit: 2022-08-19 | Discharge: 2022-08-19 | Disposition: A | Discharge status: Home / Self Care | Payer: BC Managed Care – PPO | Source: Ambulatory Visit | Attending: Urology | Admitting: Urology

## 2022-08-19 ENCOUNTER — Other Ambulatory Visit: Payer: Self-pay

## 2022-08-19 ENCOUNTER — Ambulatory Visit: Payer: BC Managed Care – PPO | Admitting: Urology

## 2022-08-19 VITALS — BP 153/94 | HR 94 | Ht 64.0 in | Wt 250.0 lb

## 2022-08-19 DIAGNOSIS — N200 Calculus of kidney: Secondary | ICD-10-CM

## 2022-08-19 DIAGNOSIS — R3129 Other microscopic hematuria: Secondary | ICD-10-CM | POA: Diagnosis not present

## 2022-08-19 LAB — URINALYSIS, COMPLETE (UACMP) WITH MICROSCOPIC
Bilirubin Urine: NEGATIVE
Glucose, UA: NEGATIVE mg/dL
Ketones, ur: NEGATIVE mg/dL
Leukocytes,Ua: NEGATIVE
Nitrite: NEGATIVE
Protein, ur: 100 mg/dL — AB
Specific Gravity, Urine: 1.015 (ref 1.005–1.030)
pH: 5.5 (ref 5.0–8.0)

## 2022-08-23 NOTE — Progress Notes (Signed)
08/26/2022 1:17 PM   Marcia Moore 1980-12-13 [id number]  Referring provider: Reubin Milan, MD [address] [place],  [place] [address]  Urological history: 1. Nephrolithiasis -RUS (05/2019) small bilateral non obstructing renal stones -KUB (10/2019) no stones seen  -KUB (08/2022) - 2.1 cm over the region of the right kidney    2. Intermediate risk hematuria -non-smoker -RUS (05/2019) small bilateral non obstructing renal stones -cysto (06/2019) mild trigonitis   Chief Complaint  Patient presents with   Nephrolithiasis   HPI: Marcia Moore is a 42 y.o. female who presents today for follow up KUB.    Previous records reviewed.   I saw her a week ago after she had been experiencing intermittent right-sided flank pain for the last 2 to 3 weeks.  A KUB noted a possible radiopaque lesion approximately 1.5 cm in size in the vicinity of the right renal pelvis.  Her UA was with microscopic hematuria and some white blood cell casts.  Her urine culture was negative.  At that appointment we discussed moving onto CT scan for further evaluation, but she deferred and would rather return in a week with a follow-up KUB.  She continues to have episodes of intermittent right-sided flank pain.   Patient denies any modifying or aggravating factors.  Patient denies any recent UTI's, gross hematuria, dysuria or suprapubic.  Patient denies any fevers, chills, nausea or vomiting.    KUB the large radiopaque lesion seen on previous KUB is not readily visible.   PMH: Past Medical History:  Diagnosis Date   Allergy    Hearing loss    left ear   Kidney stone     Surgical History: Past Surgical History:  Procedure Laterality Date   CESAREAN SECTION     GASTRIC BYPASS  2012   INNER EAR SURGERY      Home Medications:  Allergies as of 08/26/2022   No Known Allergies      Medication List        Accurate as of August 26, 2022 11:59 PM. If you have any questions, ask your  nurse or doctor.          FISH OIL PO Take by mouth.   magnesium 30 MG tablet Take 30 mg by mouth 2 (two) times daily.   multivitamin capsule Take 1 capsule by mouth daily.   PROBIOTIC PO Take by mouth.   ZINC PO Take by mouth.        Allergies: No Known Allergies  Family History: Family History  Problem Relation Age of Onset   Depression Mother    Diabetes Father    Hypertension Father    Other Father        suicide   Bone cancer Paternal Grandfather    Bladder Cancer Neg Hx    Kidney cancer Neg Hx    Prostate cancer Neg Hx     Social History:  reports that she has never smoked. She has never used smokeless tobacco. She reports that she does not drink alcohol and does not use drugs.  2) ROS: Pertinent ROS in HPI  Physical Exam: BP (!) 157/93 (BP Location: Left Arm, Patient Position: Sitting, Cuff Size: Large)   Pulse (!) 106   Ht 5\' 4"  (1.626 m)   Wt 250 lb (113.4 kg)   LMP 07/29/2022 Comment: denies preg, signed preg waiver  BMI 42.91 kg/m   Constitutional:  Well nourished. Alert and oriented, No acute distress. HEENT: [place] AT, moist mucus  membranes.  Trachea midline Cardiovascular: No clubbing, cyanosis, or edema. Respiratory: Normal respiratory effort, no increased work of breathing. Neurologic: Grossly intact, no focal deficits, moving all 4 extremities. Psychiatric: Normal mood and affect.    Laboratory Data: N/A  Pertinent Imaging: \CLINICAL DATA:  Nephrolithiasis.   EXAM: ABDOMEN - 1 VIEW   COMPARISON:  Radiograph 08/19/2022   FINDINGS: The previous 2 cm calcification projecting over the right renal shadow is not definitively seen on the current exam. No definite nephrolithiasis. No stones over the course of the ureters or in the pelvis. Enteric sutures in the left upper quadrant suggestive of gastric bypass. Normal bowel gas pattern. Small volume of formed stool in the colon.   IMPRESSION: Previous 2 cm calcification projecting  over the right renal shadow is not definitively seen on the current exam. No definite nephrolithiasis.     Electronically Signed   By: Narda Rutherford M.D.   On: 08/31/2022 16:57 I have independently reviewed the films.  See HPI.    Assessment & Plan:    1.  Nephrolithiasis -KUB - previous calcification is not as pronounced -She continues to have right-sided flank pain and nausea, we will go ahead and schedule CT renal stone study -I will call her with results  2. Microscopic hematuria -CT scan in pending -will continue to monitor, if no correction found for microscopic hematuria on CT will need to discuss moving forward with cystoscopy  Return for I will call patient with results.  These notes generated with voice recognition software. I apologize for typographical errors.  Cloretta Ned  [facility] [address]  [address] [place], [place] [address] [phone]  ***

## 2022-08-26 ENCOUNTER — Ambulatory Visit
Admission: RE | Admit: 2022-08-26 | Discharge: 2022-08-26 | Disposition: A | Discharge status: Home / Self Care | Payer: BC Managed Care – PPO | Attending: Urology | Admitting: Urology

## 2022-08-26 ENCOUNTER — Ambulatory Visit (INDEPENDENT_AMBULATORY_CARE_PROVIDER_SITE_OTHER): Payer: BC Managed Care – PPO | Admitting: Urology

## 2022-08-26 ENCOUNTER — Encounter: Payer: Self-pay | Admitting: Urology

## 2022-08-26 ENCOUNTER — Ambulatory Visit
Admission: RE | Admit: 2022-08-26 | Discharge: 2022-08-26 | Disposition: A | Discharge status: Home / Self Care | Payer: BC Managed Care – PPO | Source: Ambulatory Visit | Attending: Urology | Admitting: Urology

## 2022-08-26 VITALS — BP 157/93 | HR 106 | Ht 64.0 in | Wt 250.0 lb

## 2022-08-26 DIAGNOSIS — R3129 Other microscopic hematuria: Secondary | ICD-10-CM

## 2022-08-26 DIAGNOSIS — N200 Calculus of kidney: Secondary | ICD-10-CM

## 2022-09-04 ENCOUNTER — Ambulatory Visit
Admission: RE | Admit: 2022-09-04 | Discharge: 2022-09-04 | Disposition: A | Discharge status: Home / Self Care | Payer: BC Managed Care – PPO | Source: Ambulatory Visit | Attending: Urology | Admitting: Urology

## 2022-09-04 DIAGNOSIS — R3129 Other microscopic hematuria: Secondary | ICD-10-CM | POA: Diagnosis present

## 2022-09-04 DIAGNOSIS — N200 Calculus of kidney: Secondary | ICD-10-CM | POA: Diagnosis present

## 2022-09-06 ENCOUNTER — Other Ambulatory Visit: Payer: Self-pay | Admitting: Urology

## 2022-09-06 DIAGNOSIS — N83201 Unspecified ovarian cyst, right side: Secondary | ICD-10-CM

## 2022-09-21 ENCOUNTER — Ambulatory Visit
Admission: EM | Admit: 2022-09-21 | Discharge: 2022-09-21 | Disposition: A | Discharge status: Home / Self Care | Payer: BC Managed Care – PPO | Attending: Emergency Medicine | Admitting: Emergency Medicine

## 2022-09-21 ENCOUNTER — Ambulatory Visit (INDEPENDENT_AMBULATORY_CARE_PROVIDER_SITE_OTHER): Payer: BC Managed Care – PPO

## 2022-09-21 DIAGNOSIS — Z23 Encounter for immunization: Secondary | ICD-10-CM

## 2022-09-21 DIAGNOSIS — M79672 Pain in left foot: Secondary | ICD-10-CM | POA: Diagnosis not present

## 2022-09-21 DIAGNOSIS — T148XXA Other injury of unspecified body region, initial encounter: Secondary | ICD-10-CM | POA: Diagnosis not present

## 2022-09-21 MED ORDER — TETANUS-DIPHTH-ACELL PERTUSSIS 5-2.5-18.5 LF-MCG/0.5 IM SUSY
0.5000 mL | PREFILLED_SYRINGE | Freq: Once | INTRAMUSCULAR | Status: AC
  Administered 2022-09-21: 0.5 mL via INTRAMUSCULAR

## 2022-09-21 MED ORDER — CIPROFLOXACIN HCL 500 MG PO TABS: 500.0000 mg | ORAL_TABLET | Freq: Two times a day (BID) | ORAL | 0 refills | Status: DC

## 2022-09-21 NOTE — Discharge Instructions (Addendum)
Your x-rays did not show any evidence of retained metallic foreign body from the nail you stepped on.  You need to keep the wound on the sole of your foot clean and dry.  Also leave it open to air is much as possible so as to help prevent bacterial growth.  Take the Cipro 500 mg twice daily with food for 10 days for prevention of infection.  We have updated your tetanus shot at this most recent visit.  If you develop any swelling of your foot, increased redness, increased pain, heat or pus drainage at the site, red streaks going up your foot, or fever you need to go to the ER for evaluation.

## 2022-09-21 NOTE — ED Triage Notes (Signed)
Stepped on a rusty nail about 45 min ago. Looks like it has fragments in foot. Left foot.

## 2022-09-21 NOTE — ED Provider Notes (Signed)
[facility]    CSN: [id number] Arrival date & time: 09/21/22  1155      History   Chief Complaint Chief Complaint  Patient presents with   Foot Injury    HPI Reina Cockman is a 42 y.o. female.   HPI  42 year old female with past medical history significant for kidney stones, allergies, and hearing loss presents for evaluation of a puncture wound to the sole of her left foot.  She reports that she was doing work around her house when she stepped on a nail while wearing sneakers.  The nail went through the sole of her shoe into her foot.  She is concerned there may be fragments of the nail still in her foot but admits that she did not look closely at the nail when she pulled it out.  She is unsure when her last tetanus shot was.  Past Medical History:  Diagnosis Date   Allergy    Hearing loss    left ear   Kidney stone     Patient Active Problem List   Diagnosis Date Noted   Mild hyperlipidemia 04/15/2019   Asymptomatic microscopic hematuria 04/14/2019   Fibrocystic breast changes of both breasts 04/14/2019   Status post gastric bypass for obesity 03/02/2019   Lipoma of left upper extremity 03/02/2019    Past Surgical History:  Procedure Laterality Date   CESAREAN SECTION     GASTRIC BYPASS  2012   INNER EAR SURGERY      OB History   No obstetric history on file.      Home Medications    Prior to Admission medications   Medication Sig Start Date End Date Taking? Authorizing Provider  ciprofloxacin (CIPRO) 500 MG tablet Take 1 tablet (500 mg total) by mouth 2 (two) times daily. 09/21/22  Yes Becky Augusta, NP  magnesium 30 MG tablet Take 30 mg by mouth 2 (two) times daily.    [provider]  Multiple Vitamin (MULTIVITAMIN) capsule Take 1 capsule by mouth daily.    [provider]  Multiple Vitamins-Minerals (ZINC PO) Take by mouth.    [provider]  Omega-3 Fatty Acids (FISH OIL PO) Take by mouth.    [provider]  Probiotic Product (PROBIOTIC PO) Take by mouth.    [provider]    Family History Family History  Problem Relation Age of Onset   Depression Mother    Diabetes Father    Hypertension Father    Other Father        suicide   Bone cancer Paternal Grandfather    Bladder Cancer Neg Hx    Kidney cancer Neg Hx    Prostate cancer Neg Hx     Social History Social History   Tobacco Use   Smoking status: Never   Smokeless tobacco: Never  Vaping Use   Vaping status: Never Used  Substance Use Topics   Alcohol use: Never   Drug use: Never     Allergies   Patient has no known allergies.   Review of Systems Review of Systems  Constitutional:  Negative for fever.  Skin:  Positive for wound. Negative for color change.     Physical Exam Triage Vital Signs ED Triage Vitals  Encounter Vitals Group     BP 09/21/22 1233 (!) 163/106     Systolic BP Percentile --      Diastolic BP Percentile --      Pulse Rate 09/21/22 1233 90  Resp 09/21/22 1233 17     Temp 09/21/22 1233 98.6 F (37 C)     Temp Source 09/21/22 1233 Oral     SpO2 09/21/22 1233 97 %     Weight --      Height --      Head Circumference --      Peak Flow --      Pain Score 09/21/22 1232 1     Pain Loc --      Pain Education --      Exclude from Growth Chart --    No data found.  Updated Vital Signs BP (!) 163/106 (BP Location: Right Arm)   Pulse 90   Temp 98.6 F (37 C) (Oral)   Resp 17   LMP 09/04/2022 Comment: denies preg, signed preg waiver  SpO2 97%   Visual Acuity Right Eye Distance:   Left Eye Distance:   Bilateral Distance:    Right Eye Near:   Left Eye Near:    Bilateral Near:     Physical Exam Vitals and nursing note reviewed.  Constitutional:      Appearance: Normal appearance. She is not ill-appearing.  HENT:     Head: Normocephalic and atraumatic.  Skin:    General: Skin is warm and dry.     Capillary Refill: Capillary refill takes less than 2 seconds.      Findings: Bruising present.  Neurological:     General: No focal deficit present.     Mental Status: She is alert and oriented to person, place, and time.      UC Treatments / Results  Labs (all labs ordered are listed, but only abnormal results are displayed) Labs Reviewed - No data to display  EKG   Radiology No results found.  Procedures Procedures (including critical care time)  Medications Ordered in UC Medications  Tdap (BOOSTRIX) injection 0.5 mL (0.5 mLs Intramuscular Given 09/21/22 1235)    Initial Impression / Assessment and Plan / UC Course  I have reviewed the triage vital signs and the nursing notes.  Pertinent labs & imaging results that were available during my care of the patient were reviewed by me and considered in my medical decision making (see chart for details).   Patient is a pleasant, nontoxic-appearing 42 year old female presenting for evaluation of puncture wound sustained from a nail to the sole of her left foot.  She is a puncture wound between the great toe and second toe and the ball of the foot with very minimal venous oozing.  There is no surrounding erythema but there is some ecchymosis starting to form.  She has full sensation and range of motion of her toes.  Given that she is unclear as to whether or not did nail was intact I will obtain an x-ray to rule out any retained foreign body.  Will also update her tetanus shot since she is not sure when her last tetanus shot was.  If there is a retained foreign body I will refer her to podiatry.  Otherwise, plan is to discharge patient home on Cipro 500 mg twice daily for 7 days as prophylaxis against Pseudomonas infection.  Left foot films independently reviewed and evaluated by me.  Impression: No evidence of metallic foreign body to the ball of the foot where the patient has sustained a puncture wound.  There is a lucency in the arch of the foot closer to the calcaneus.  Radiology overread is  pending.   Final Clinical Impressions(s) /  UC Diagnoses   Final diagnoses:  Puncture wound     Discharge Instructions      Your x-rays did not show any evidence of retained metallic foreign body from the nail you stepped on.  You need to keep the wound on the sole of your foot clean and dry.  Also leave it open to air is much as possible so as to help prevent bacterial growth.  Take the Cipro 500 mg twice daily with food for 10 days for prevention of infection.  We have updated your tetanus shot at this most recent visit.  If you develop any swelling of your foot, increased redness, increased pain, heat or pus drainage at the site, red streaks going up your foot, or fever you need to go to the ER for evaluation.     ED Prescriptions     Medication Sig Dispense Auth. Provider   ciprofloxacin (CIPRO) 500 MG tablet Take 1 tablet (500 mg total) by mouth 2 (two) times daily. 20 tablet Becky Augusta, NP      PDMP not reviewed this encounter.   Becky Augusta, NP 09/21/22 1315

## 2022-09-26 ENCOUNTER — Encounter: Payer: BC Managed Care – PPO | Admitting: Obstetrics and Gynecology

## 2022-10-03 ENCOUNTER — Ambulatory Visit: Payer: BC Managed Care – PPO | Admitting: Obstetrics and Gynecology

## 2022-10-03 ENCOUNTER — Other Ambulatory Visit (HOSPITAL_COMMUNITY)
Admission: RE | Admit: 2022-10-03 | Discharge: 2022-10-03 | Disposition: A | Discharge status: Home / Self Care | Payer: BC Managed Care – PPO | Source: Ambulatory Visit | Attending: Obstetrics and Gynecology | Admitting: Obstetrics and Gynecology

## 2022-10-03 ENCOUNTER — Encounter: Payer: Self-pay | Admitting: Obstetrics and Gynecology

## 2022-10-03 VITALS — BP 139/93 | HR 101 | Ht 63.5 in | Wt 257.0 lb

## 2022-10-03 DIAGNOSIS — Z124 Encounter for screening for malignant neoplasm of cervix: Secondary | ICD-10-CM | POA: Insufficient documentation

## 2022-10-03 DIAGNOSIS — N83201 Unspecified ovarian cyst, right side: Secondary | ICD-10-CM | POA: Diagnosis not present

## 2022-10-03 DIAGNOSIS — N83202 Unspecified ovarian cyst, left side: Secondary | ICD-10-CM | POA: Diagnosis not present

## 2022-10-03 DIAGNOSIS — Z1231 Encounter for screening mammogram for malignant neoplasm of breast: Secondary | ICD-10-CM

## 2022-10-03 NOTE — Progress Notes (Signed)
GYNECOLOGY PROGRESS NOTE  Subjective:    Patient ID: Marcia Moore, female    DOB: 1980/07/21, 42 y.o.   MRN: [id number]  HPI  Patient is a 42 y.o. [id number] female who presents as a referral from Urology for incidental findings on recent CT renal stone study of bilateral ovarian cysts. 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 However provider seen noted concerns for possible kidney stones, as patient also has chronic hematuria.  Had CT scan performed by Urologist who though it may be kidney stones. Has had negative workup for hematuria. Notes that her back pain has since resolved after seeing a chiropractor and taking NSAIDs.  Does note occasional pelvic pressure but no significant pelvic pain.    Patient notes that it has been several years since she has seen a Theatre manager. Is not up to date on routine gynecologic screenings.    Menstrual History: OB History     Gravida  2   Para  2   Term  2   Preterm      AB      Living  2      SAB      IAB      Ectopic      Multiple      Live Births  2           Menarche age: 61 Patient's last menstrual period was 09/04/2022. Period Cycle (Days): 31 Period Duration (Days): 7 Period Pattern: Regular Menstrual Flow: Moderate, Light (Day 2-4 moderate) Menstrual Control: Maxi pad (Overnight pad) Menstrual Control Change Freq (Hours): 8 Dysmenorrhea: (!) Mild Dysmenorrhea Symptoms: Cramping   Gynecologic History: Last pap smear: 04/14/2019. Results were: Normal Last mammogram: patient has never had one   Past Medical History:  Diagnosis Date   Allergy    Hearing loss    left ear   Kidney stone     Family History  Problem Relation Age of Onset   Depression Mother    Diabetes Father    Hypertension Father    Other Father        suicide   Bone cancer Paternal Grandfather    Bladder Cancer Neg Hx    Kidney cancer Neg Hx    Prostate cancer Neg Hx     Past Surgical  History:  Procedure Laterality Date   CESAREAN SECTION     GASTRIC BYPASS  2012   INNER EAR SURGERY      Social History   Socioeconomic History   Marital status: Married    Spouse name: Not on file   Number of children: 2   Years of education: Not on file   Highest education level: Not on file  Occupational History   Not on file  Tobacco Use   Smoking status: Never   Smokeless tobacco: Never  Vaping Use   Vaping status: Never Used  Substance and Sexual Activity   Alcohol use: Never   Drug use: Never   Sexual activity: Yes    Partners: Male  Other Topics Concern   Not on file  Social History Narrative   Not on file   Social Determinants of Health   Financial Resource Strain: Not on file  Food Insecurity: Not on file  Transportation Needs: Not on file  Physical Activity: Not on file  Stress: Not on file  Social Connections: Not on file  Intimate Partner Violence: Not on file    Current Outpatient  Medications on File Prior to Visit  Medication Sig Dispense Refill   magnesium 30 MG tablet Take 30 mg by mouth 2 (two) times daily.     Multiple Vitamin (MULTIVITAMIN) capsule Take 1 capsule by mouth daily.     Multiple Vitamins-Minerals (ZINC PO) Take by mouth.     Omega-3 Fatty Acids (FISH OIL PO) Take by mouth.     Probiotic Product (PROBIOTIC PO) Take by mouth.     ciprofloxacin (CIPRO) 500 MG tablet Take 1 tablet (500 mg total) by mouth 2 (two) times daily. (Patient not taking: Reported on 10/03/2022) 20 tablet 0   No current facility-administered medications on file prior to visit.    No Known Allergies  Review of Systems Pertinent items noted in HPI and remainder of comprehensive ROS otherwise negative.   Objective:   Blood pressure (!) 139/93, pulse (!) 101, height 5' 3.5" (1.613 m), weight 257 lb (116.6 kg), last menstrual period 09/04/2022. Body mass index is 44.81 kg/m. General appearance: alert and no distress Abdomen: soft, non-tender; bowel sounds  normal; no masses,  no organomegaly Pelvic: external genitalia normal, rectovaginal septum normal.  Vagina without discharge.  Cervix normal appearing, no lesions and no motion tenderness.  Uterus mobile, nontender, normal shape and size.  Adnexae non-palpable, nontender bilaterally.  Extremities: extremities normal, atraumatic, no cyanosis or edema Neurologic: Grossly normal    Imaging:   CLINICAL DATA:  Right flank pain   EXAM: CT ABDOMEN AND PELVIS WITHOUT CONTRAST   TECHNIQUE: Multidetector CT imaging of the abdomen and pelvis was performed following the standard protocol without IV contrast.   RADIATION DOSE REDUCTION: This exam was performed according to the departmental dose-optimization program which includes automated exposure control, adjustment of the mA and/or kV according to patient size and/or use of iterative reconstruction technique.   COMPARISON:  None Available.   FINDINGS: Lower chest: No acute abnormality.   Hepatobiliary: No focal liver abnormality is seen. Cholelithiasis. No gallbladder wall thickening. No biliary dilatation.   Pancreas: Unremarkable. No pancreatic ductal dilatation or surrounding inflammatory changes.   Spleen: Normal in size without focal abnormality.   Adrenals/Urinary Tract: Bilateral adrenal glands are unremarkable. No hydronephrosis. Punctate nonobstructing stone of the mid region of the right kidney. Bladder is decompressed.   Stomach/Bowel: Postsurgical changes of stomach. Appendix appears normal. Mild diverticulosis. No evidence of bowel wall thickening, distention, or inflammatory changes.   Vascular/Lymphatic: No significant vascular findings are present. No enlarged abdominal or pelvic lymph nodes.   Reproductive: Status post hysterectomy. Multiple bilateral adnexal cysts. Two right-sided cyst measuring 5.1 cm and 3.7 cm on the right and left adnexal cyst measuring 4.1 cm.   Other: No abdominal wall hernia or  abnormality. No abdominopelvic ascites.   Musculoskeletal: No acute or significant osseous findings.   IMPRESSION: 1. No acute findings in the abdomen or pelvis, including no evidence of obstructive uropathy. 2. Punctate nonobstructing right renal stone. 3. Multiple bilateral adnexal cysts measuring up to 5.1 cm on the right. Recommend follow-up pelvic ultrasound in 3-6 months. Reference: JACR 2020 Feb;17(2):248-254 4. Cholelithiasis.     Electronically Signed   By: Allegra Lai M.D.   On: 09/04/2022 17:05   Assessment:   1. Cysts of both ovaries   2. Cervical cancer screening   3. Breast cancer screening by mammogram      Plan:   1. Cysts of both ovaries - Discussion had on CT findings regarding bilateral cysts.  Advised that cysts appear to be benign.  Reviewed management options for ovarian cysts, including expectant management with repeat SU in 3 months, or treatment with OCPs for 6-8 weeks to accelerate resolution of th cysts. Based on size of cyst and benign nature, surgical intervention not warranted at this time.  Patient notes that she would like expectant management. Notes that she has had issues in the past with birth control, and would like to avoid if possible. As long as patient remains relatively asymptomatic, can manage expectantly at this time.  To f/u in 3 months for repeat ultrasound.  - US PELVIC COMPLETE WITH TRANSVAGINAL; Future  2. Cervical cancer screening - Patient over due for cervical screening, performed today.  - Cytology - PAP  3. Breast cancer screening by mammogram - Patient has never had a mammogram. Advised on importance of breast cancer screening. Notes she has will lose her insurance at the end of this month. Given info to have scheduled prior to the end of the month.  - MM 3D SCREENING MAMMOGRAM BILATERAL BREAST; Future  A total of 40 minutes were spent during this encounter, including review of previous progress notes, recent imaging  and labs, face-to-face with time with patient involving counseling and coordination of care, as well as documentation for current visit.    Hildred Laser, MD [facility]

## 2022-10-04 ENCOUNTER — Encounter: Payer: Self-pay | Admitting: Obstetrics and Gynecology

## 2022-10-06 ENCOUNTER — Telehealth: Payer: Self-pay | Admitting: Urology

## 2022-10-06 NOTE — Telephone Encounter (Signed)
Please let Nylee know that I received the notes from Dr. Oretha Milch office regarding her visits concerning her ovarian cyst.  I recommend that we schedule cystoscopy with Dr. Apolinar Junes to complete her workup for the microscopic blood in her urine as Dr. Oretha Milch note did not indicate the ovarian cyst would cause microscopic hematuria.

## 2022-10-07 NOTE — Telephone Encounter (Signed)
Spoke with patient and scheduled appt

## 2022-10-08 LAB — CYTOLOGY - PAP
Adequacy: ABSENT
Comment: NEGATIVE
Diagnosis: NEGATIVE
High risk HPV: NEGATIVE

## 2022-11-13 ENCOUNTER — Other Ambulatory Visit: Payer: BC Managed Care – PPO | Admitting: Urology

## 2022-11-22 ENCOUNTER — Other Ambulatory Visit: Payer: BC Managed Care – PPO | Admitting: Urology

## 2022-11-29 ENCOUNTER — Ambulatory Visit: Payer: Commercial Managed Care - PPO | Admitting: Internal Medicine

## 2023-01-29 ENCOUNTER — Ambulatory Visit: Payer: BC Managed Care – PPO

## 2023-05-15 ENCOUNTER — Ambulatory Visit (INDEPENDENT_AMBULATORY_CARE_PROVIDER_SITE_OTHER): Admitting: Urology

## 2023-05-15 VITALS — BP 150/99 | HR 109

## 2023-05-15 DIAGNOSIS — R3129 Other microscopic hematuria: Secondary | ICD-10-CM | POA: Diagnosis not present

## 2023-05-15 DIAGNOSIS — N200 Calculus of kidney: Secondary | ICD-10-CM

## 2023-05-16 ENCOUNTER — Encounter: Payer: Self-pay | Admitting: Urology

## 2023-05-16 ENCOUNTER — Other Ambulatory Visit
Admission: RE | Admit: 2023-05-16 | Discharge: 2023-05-16 | Disposition: A | Discharge status: Home / Self Care | Attending: Urology | Admitting: Urology

## 2023-05-16 DIAGNOSIS — R3129 Other microscopic hematuria: Secondary | ICD-10-CM | POA: Diagnosis present

## 2023-05-16 DIAGNOSIS — N200 Calculus of kidney: Secondary | ICD-10-CM | POA: Diagnosis present

## 2023-05-16 LAB — BASIC METABOLIC PANEL WITH GFR
Anion gap: 8 (ref 5–15)
BUN: 11 mg/dL (ref 6–20)
CO2: 25 mmol/L (ref 22–32)
Calcium: 8.5 mg/dL — ABNORMAL LOW (ref 8.9–10.3)
Chloride: 103 mmol/L (ref 98–111)
Creatinine, Ser: 0.96 mg/dL (ref 0.44–1.00)
GFR, Estimated: >60 mL/min (ref >60)
Glucose, Bld: 123 mg/dL — ABNORMAL HIGH (ref 70–99)
Potassium: 3.9 mmol/L (ref 3.5–5.1)
Sodium: 136 mmol/L (ref 135–145)

## 2023-05-16 LAB — URINALYSIS, COMPLETE
Bilirubin, UA: NEGATIVE
Glucose, UA: NEGATIVE
Ketones, UA: NEGATIVE
Nitrite, UA: NEGATIVE
Specific Gravity, UA: 1.01 (ref 1.005–1.030)
Urobilinogen, Ur: 0.2 mg/dL (ref 0.2–1.0)
pH, UA: 6 (ref 5.0–7.5)

## 2023-05-16 LAB — MICROSCOPIC EXAMINATION
Epithelial Cells (non renal): >10 /HPF — AB (ref 0–10)
RBC, Urine: >30 /HPF — AB (ref 0–2)

## 2023-05-16 NOTE — Progress Notes (Signed)
   05/15/23  CC:  Chief Complaint  Patient presents with   Cysto    HPI: 43 year old female with persistent microscopic hematuria who returns today for cystoscopy.  Please see previous notes for details.  She is otherwise asymptomatic.  Blood pressure (!) 150/99, pulse (!) 109. NED. A&Ox3.   No respiratory distress   Abd soft, NT, ND Normal external genitalia with patent urethral meatus  Cystoscopy Procedure Note  Patient identification was confirmed, informed consent was obtained, and patient was prepped using Betadine solution.  Lidocaine jelly was administered per urethral meatus.    Procedure: - Flexible cystoscope introduced, without any difficulty.   - Thorough search of the bladder revealed:    normal urethral meatus    normal urothelium    no stones    no ulcers     no tumors    no urethral polyps    no trabeculation  - Ureteral orifices were normal in position and appearance.  Post-Procedure: - Patient tolerated the procedure well  Assessment/ Plan:  1. Microscopic hematuria (Primary) .  Cystoscopy today was reassuring  She has not had labs in quite some time, will check a BMP to ensure normal renal function. - Urinalysis, Complete - Basic metabolic panel; Future  2. Nephrolithiasis Minimal - Basic metabolic panel; Future   Follow-up annually with UA  Dustin Gimenez, MD

## 2024-05-10 ENCOUNTER — Ambulatory Visit: Admitting: Urology
